# Patient Record
Sex: Female | Born: 1995 | Race: Black or African American | Hispanic: No | Marital: Single | Smoking: Current every day smoker
Health system: Southern US, Community
[De-identification: ages and names within clinical notes are randomized; demographics above are authoritative.]

## PROBLEM LIST (undated history)

## (undated) ENCOUNTER — Emergency Department (HOSPITAL_COMMUNITY): Discharge status: Absent without leave | Payer: Self-pay

## (undated) DIAGNOSIS — R51 Headache: Secondary | ICD-10-CM

## (undated) DIAGNOSIS — R519 Headache, unspecified: Secondary | ICD-10-CM

---

## 2017-03-20 ENCOUNTER — Emergency Department (HOSPITAL_COMMUNITY): Payer: Self-pay

## 2017-03-20 ENCOUNTER — Emergency Department (HOSPITAL_COMMUNITY)
Admission: EM | Admit: 2017-03-20 | Discharge: 2017-03-20 | Disposition: A | Discharge status: Home / Self Care | Payer: Self-pay | Attending: Emergency Medicine | Admitting: Emergency Medicine

## 2017-03-20 ENCOUNTER — Encounter (HOSPITAL_COMMUNITY): Payer: Self-pay | Admitting: Emergency Medicine

## 2017-03-20 DIAGNOSIS — K529 Noninfective gastroenteritis and colitis, unspecified: Secondary | ICD-10-CM | POA: Insufficient documentation

## 2017-03-20 LAB — COMPREHENSIVE METABOLIC PANEL
ALT: 14 U/L (ref 14–54)
AST: 17 U/L (ref 15–41)
Albumin: 3.5 g/dL (ref 3.5–5.0)
Alkaline Phosphatase: 42 U/L (ref 38–126)
Anion gap: 8 (ref 5–15)
BUN: 10 mg/dL (ref 6–20)
CO2: 29 mmol/L (ref 22–32)
Calcium: 9 mg/dL (ref 8.9–10.3)
Chloride: 101 mmol/L (ref 101–111)
Creatinine, Ser: 0.79 mg/dL (ref 0.44–1.00)
GFR calc Af Amer: >60 mL/min (ref >60)
GFR calc non Af Amer: >60 mL/min (ref >60)
Glucose, Bld: 88 mg/dL (ref 65–99)
Potassium: 3.8 mmol/L (ref 3.5–5.1)
Sodium: 138 mmol/L (ref 135–145)
Total Bilirubin: 0.6 mg/dL (ref 0.3–1.2)
Total Protein: 7.1 g/dL (ref 6.5–8.1)

## 2017-03-20 LAB — URINALYSIS, ROUTINE W REFLEX MICROSCOPIC
Bilirubin Urine: NEGATIVE
Glucose, UA: NEGATIVE mg/dL
Hgb urine dipstick: NEGATIVE
Ketones, ur: 20 mg/dL — AB
Nitrite: NEGATIVE
Protein, ur: NEGATIVE mg/dL
Specific Gravity, Urine: 1.019 (ref 1.005–1.030)
pH: 7 (ref 5.0–8.0)

## 2017-03-20 LAB — LIPASE, BLOOD: Lipase: 18 U/L (ref 11–51)

## 2017-03-20 LAB — CBC
HCT: 33.2 % — ABNORMAL LOW (ref 36.0–46.0)
Hemoglobin: 11.6 g/dL — ABNORMAL LOW (ref 12.0–15.0)
MCH: 31.8 pg (ref 26.0–34.0)
MCHC: 34.9 g/dL (ref 30.0–36.0)
MCV: 91 fL (ref 78.0–100.0)
Platelets: 316 10*3/uL (ref 150–400)
RBC: 3.65 MIL/uL — ABNORMAL LOW (ref 3.87–5.11)
RDW: 12.3 % (ref 11.5–15.5)
WBC: 5.5 10*3/uL (ref 4.0–10.5)

## 2017-03-20 LAB — POC URINE PREG, ED: Preg Test, Ur: NEGATIVE

## 2017-03-20 MED ORDER — HYDROCODONE-ACETAMINOPHEN 5-325 MG PO TABS: 1.0000 | ORAL_TABLET | Freq: Four times a day (QID) | ORAL | 0 refills | Status: DC | PRN

## 2017-03-20 MED ORDER — IOPAMIDOL (ISOVUE-300) INJECTION 61%
INTRAVENOUS | Status: AC
  Filled 2017-03-20: qty 100

## 2017-03-20 MED ORDER — IOPAMIDOL (ISOVUE-300) INJECTION 61%
100.0000 mL | Freq: Once | INTRAVENOUS | Status: AC | PRN
  Administered 2017-03-20: 100 mL via INTRAVENOUS

## 2017-03-20 MED ORDER — SODIUM CHLORIDE 0.9 % IV BOLUS (SEPSIS)
500.0000 mL | Freq: Once | INTRAVENOUS | Status: AC
  Administered 2017-03-20: 500 mL via INTRAVENOUS

## 2017-03-20 MED ORDER — SODIUM CHLORIDE 0.9 % IV SOLN
INTRAVENOUS | Status: DC
  Administered 2017-03-20: 18:00:00 via INTRAVENOUS

## 2017-03-20 MED ORDER — ACETAMINOPHEN 325 MG PO TABS: 650.0000 mg | ORAL_TABLET | Freq: Once | ORAL | Status: DC

## 2017-03-20 NOTE — ED Provider Notes (Signed)
[facility]-EMERGENCY DEPT Provider Note   CSN: [id number] Arrival date & time: 03/20/17  1246     History   Chief Complaint Chief Complaint  Patient presents with  . Abdominal Pain    HPI Erin Christensen is a 21 y.o. female.  Patient with complaint of right-sided abdominal pain that began 3 days ago. Initially started in the right lower quadrant then moved to the right upper quadrant. Patient feels that she's had problems with constipation no nausea no vomiting. No diarrhea. Patient has felt she's had dysuria. No fevers. No similar symptoms in the past. Pain is been constant on the right side.      History reviewed. No pertinent past medical history.  There are no active problems to display for this patient.   History reviewed. No pertinent surgical history.  OB History    No data available       Home Medications    Prior to Admission medications   Medication Sig Start Date End Date Taking? Authorizing Provider  HYDROcodone-acetaminophen (NORCO/VICODIN) 5-325 MG tablet Take 1-2 tablets by mouth every 6 (six) hours as needed for moderate pain. 03/20/17   Vanetta Mulders, MD    Family History No family history on file.  Social History Social History  Substance Use Topics  . Smoking status: Never Smoker  . Smokeless tobacco: Not on file  . Alcohol use No     Allergies   Patient has no known allergies.   Review of Systems Review of Systems  Constitutional: Negative for fever.  HENT: Negative for congestion.   Eyes: Negative for visual disturbance.  Respiratory: Negative for shortness of breath.   Cardiovascular: Negative for chest pain.  Gastrointestinal: Positive for abdominal pain and constipation. Negative for diarrhea, nausea and vomiting.  Genitourinary: Positive for dysuria.  Musculoskeletal: Negative for back pain.  Skin: Negative for rash.  Neurological: Negative for headaches.  Hematological: Does not bruise/bleed easily.  Psychiatric/Behavioral:  Negative for confusion.     Physical Exam Updated Vital Signs BP 130/79 (BP Location: Left Arm)   Pulse 86   Temp 98.3 F (36.8 C) (Oral)   Resp 18   Ht 1.753 m ( )   Wt 89.8 kg (198 lb)   LMP 02/18/2017 Comment: Upreg neg 03/20/17  SpO2 100%   BMI 29.24 kg/m   Physical Exam  Constitutional: She is oriented to person, place, and time. She appears well-developed and well-nourished. No distress.  HENT:  Head: Normocephalic and atraumatic.  Mouth/Throat: Oropharynx is clear and moist.  Eyes: Pupils are equal, round, and reactive to light. Conjunctivae and EOM are normal.  Neck: Normal range of motion. Neck supple.  Cardiovascular: Normal rate, regular rhythm and intact distal pulses.   Pulmonary/Chest: Effort normal and breath sounds normal. No respiratory distress.  Abdominal: Soft. Bowel sounds are normal. There is no tenderness.  Musculoskeletal: Normal range of motion.  Neurological: She is alert and oriented to person, place, and time. No cranial nerve deficit or sensory deficit. She exhibits normal muscle tone. Coordination normal.  Skin: Skin is warm. No rash noted.  Nursing note and vitals reviewed.    ED Treatments / Results  Labs (all labs ordered are listed, but only abnormal results are displayed) Labs Reviewed  CBC - Abnormal; Notable for the following:       Result Value   RBC 3.65 (*)    Hemoglobin 11.6 (*)    HCT 33.2 (*)    All other components within normal limits  URINALYSIS, ROUTINE  W REFLEX MICROSCOPIC - Abnormal; Notable for the following:    Ketones, ur 20 (*)    Leukocytes, UA TRACE (*)    Bacteria, UA RARE (*)    Squamous Epithelial / LPF 0-5 (*)    All other components within normal limits  LIPASE, BLOOD  COMPREHENSIVE METABOLIC PANEL  POC URINE PREG, ED    EKG  EKG Interpretation None       Radiology Ct Abdomen Pelvis W Contrast  Result Date: 03/20/2017 CLINICAL DATA:  Right-sided abdominal pain.  Possible fever. EXAM: CT  ABDOMEN AND PELVIS WITH CONTRAST TECHNIQUE: Multidetector CT imaging of the abdomen and pelvis was performed using the standard protocol following bolus administration of intravenous contrast. CONTRAST:  ISOVUE-300 IOPAMIDOL (ISOVUE-300) INJECTION 61% COMPARISON:  None. FINDINGS: Lower chest: No acute abnormality. Hepatobiliary: No focal liver abnormality is seen. No gallstones, gallbladder wall thickening, or biliary dilatation. Pancreas: Unremarkable. No pancreatic ductal dilatation or surrounding inflammatory changes. Spleen: Normal in size without focal abnormality. Adrenals/Urinary Tract: Adrenal glands are unremarkable. Kidneys are normal, without renal calculi, focal lesion, or hydronephrosis. Bladder is unremarkable. Stomach/Bowel: The stomach is normal in appearance. Evaluation of the small bowel and colon is limited due to lack of oral contrast. However, there is suggestion of colonic wall thickening and adjacent fat stranding involving the ascending colon, transverse colon, and descending colon. No pneumatosis. It would be difficult to confidently exclude small bowel involvement. There is increased attenuation of fat of the mesentary. No bowel obstruction. The appendix is best visualized on coronal images with no evidence of appendicitis. Vascular/Lymphatic: No significant vascular findings are present. No enlarged abdominal or pelvic lymph nodes. Reproductive: The uterus is deviated to the right but otherwise unremarkable. No adnexal masses are seen. Other: There is a fat containing periumbilical hernia. Musculoskeletal: No acute or significant osseous findings. IMPRESSION: 1. Evaluation of the large and small bowel is limited due to lack of oral contrast. However, there is suggested colonic wall thickening and adjacent fat stranding consistent with colitis. Involvement of the small bowel cannot be confidently excluded. Infectious and inflammatory etiologies are possible. Inflammatory bowel disease  should be considered. Vascular causes are considered less likely. 2. No other acute abnormalities. Electronically Signed   By: Gerome Sam III M.D   On: 03/20/2017 17:55    Procedures Procedures (including critical care time)  Medications Ordered in ED Medications  0.9 %  sodium chloride infusion ( Intravenous New Bag/Given 03/20/17 1747)  sodium chloride 0.9 % bolus 500 mL (500 mLs Intravenous New Bag/Given 03/20/17 1747)  iopamidol (ISOVUE-300) 61 % injection 100 mL (100 mLs Intravenous Contrast Given 03/20/17 1734)     Initial Impression / Assessment and Plan / ED Course  I have reviewed the triage vital signs and the nursing notes.  Pertinent labs & imaging results that were available during my care of the patient were reviewed by me and considered in my medical decision making (see chart for details).     CT scan suggestive of colitis. No evidence of appendicitis no evidence of any gallbladder problems. Patient's labs without a sniffing abnormalities. No leukocytosis. Urinalysis negative for urinary tract infection.Also pregnancy test negative.  Patient nontoxic no acute distress. Will treat symptomatically and have her follow-up with gastroenterology. Symptoms could be consistent with inflammatory bowel disease.  Final Clinical Impressions(s) / ED Diagnoses   Final diagnoses:  Colitis    New Prescriptions New Prescriptions   HYDROCODONE-ACETAMINOPHEN (NORCO/VICODIN) 5-325 MG TABLET    Take 1-2 tablets by  mouth every 6 (six) hours as needed for moderate pain.     Vanetta MuldersZackowski, Kiara Keep, MD 03/20/17 [address] [id number]

## 2017-03-20 NOTE — Discharge Instructions (Signed)
Would recommend a bland diet. Take pain medicine as needed and as directed. Follow-up with gastroenterology if not improving over the next several days. Return for any new or worse symptoms.

## 2017-03-20 NOTE — ED Triage Notes (Signed)
Pt from home with c/o right sided abdominal pain that began about 3 days ago. Pt states pain is a sharp pressure that she describes as 9/10. Pt reports intermittent constipation and irregular period last month. Pt denies emesis.

## 2017-04-04 ENCOUNTER — Ambulatory Visit (INDEPENDENT_AMBULATORY_CARE_PROVIDER_SITE_OTHER): Payer: Self-pay | Admitting: Physician Assistant

## 2017-04-04 ENCOUNTER — Encounter: Payer: Self-pay | Admitting: Physician Assistant

## 2017-04-04 VITALS — BP 102/68 | HR 76 | Ht 67.5 in | Wt 193.0 lb

## 2017-04-04 DIAGNOSIS — R9389 Abnormal findings on diagnostic imaging of other specified body structures: Secondary | ICD-10-CM

## 2017-04-04 DIAGNOSIS — R938 Abnormal findings on diagnostic imaging of other specified body structures: Secondary | ICD-10-CM

## 2017-04-04 DIAGNOSIS — K5900 Constipation, unspecified: Secondary | ICD-10-CM

## 2017-04-04 NOTE — Progress Notes (Signed)
Reviewed and agree with initial management plan.  Dalila Arca T. Aleira Deiter, MD FACG 

## 2017-04-04 NOTE — Progress Notes (Signed)
Chief Complaint: Abdominal Pain  HPI:  Erin Christensen is a 21 year old female with no significant past medical history who presents to clinic today for follow-up after being seen in the ER and diagnosed with colitis.   Per review of chart, patient was seen in the ER 03/20/17 and at that time described a right-sided abdominal pain that had begun 3 days prior. This initially started in the right lower quadrant and moved to the right upper quadrant. She described problems with constipation but no nausea, vomiting or diarrhea. Labs at that time showed a CBC with a minimally decreased hemoglobin 11.6, urinalysis showed trace leukocytes and rare bacteria, lipase, CMP and urine pregnancy test were negative/normal. Patient had a CT of the abdomen and pelvis with contrast which showed that evaluation of large and small bowel was limited due to lack of oral contrast, however there was suggested colonic wall thickening and adjacent fat stranding consistent with colitis. Involvement of the small bowel could not be confidently excluded. Infectious and inflammatory etiologies were possible. Inflammatory bowel disease should be considered. Vascular causes were considered less likely. No other acute abnormalities. Patient was given hydrocodone and told to follow with our clinic.    Today, the patient presents to clinic and tells me that all of her symptoms are gone. She remained on a bland diet up until today and had used her Hydrocodone occasionally for abdominal pain but has not used this in many days. Patient describes trouble with constipation prior to her ER visit, but since then she has had a daily solid stool in the morning "except for this morning". Patient does describe that she "went hard on [place] food" for 3 days leading up to her diagnosis of colitis. The patient feels overall improved. She does tell me that when she sneezes she will have a little bit of right upper quadrant "pinching", but this is "nothing  compared to what it was". Patient denies family history of ulcerative colitis or Crohn's.   Patient denies fever, chills, blood in her stool, melena, weight loss, fatigue, anorexia, nausea, vomiting, heartburn or reflux.  Past medical and surgical history: None  Current Outpatient Prescriptions  Medication Sig Dispense Refill  . HYDROcodone-acetaminophen (NORCO/VICODIN) 5-325 MG tablet Take 1-2 tablets by mouth every 6 (six) hours as needed for moderate pain. 10 tablet 0   No current facility-administered medications for this visit.     Allergies as of 04/04/2017  . (No Known Allergies)    No family history on file.  Social History   Social History  . Marital status: Single    Spouse name: N/A  . Number of children: N/A  . Years of education: N/A   Occupational History  . Not on file.   Social History Main Topics  . Smoking status: Never Smoker  . Smokeless tobacco: Not on file  . Alcohol use No  . Drug use: No  . Sexual activity: Not on file   Other Topics Concern  . Not on file   Social History Narrative  . No narrative on file    Review of Systems:    Constitutional: No weight loss, fever or chills Skin: No rash Cardiovascular: No chest pain Respiratory: No SOB  Gastrointestinal: See HPI and otherwise negative Genitourinary: No dysuria  Neurological: No headache Musculoskeletal: No new muscle or joint pain Hematologic: No bruising Psychiatric: No history of depression or anxiety   Physical Exam:  Vital signs: BP 102/68   Pulse 76   Ht 5' 7.5" (  1.715 m)   Wt 193 lb (87.5 kg)   LMP 03/04/2017 (Approximate)   BMI 29.78 kg/m    Constitutional:   Very Pleasant and Polite AA female appears to be in NAD, Well developed, Well nourished, alert and cooperative Head:  Normocephalic and atraumatic. Eyes:   PEERL, EOMI. No icterus. Conjunctiva pink. Ears:  Normal auditory acuity. Neck:  Supple Throat: Oral cavity and pharynx without inflammation, swelling  or lesion.  Respiratory: Respirations even and unlabored. Lungs clear to auscultation bilaterally.   No wheezes, crackles, or rhonchi.  Cardiovascular: Normal S1, S2. No MRG. Regular rate and rhythm. No peripheral edema, cyanosis or pallor.  Gastrointestinal:  Soft, nondistended, nontender. No rebound or guarding. Normal bowel sounds. No appreciable masses or hepatomegaly. Rectal:  Not performed.  Msk:  Symmetrical without gross deformities. Without edema, no deformity or joint abnormality.  Neurologic:  Alert and  oriented x4;  grossly normal neurologically.  Skin:   Dry and intact without significant lesions or rashes. Psychiatric:  Demonstrates good judgement and reason without abnormal affect or behaviors.  MOST RECENT LABS AND IMAGING: CBC    Component Value Date/Time   WBC 5.5 03/20/2017 1344   RBC 3.65 (L) 03/20/2017 1344   HGB 11.6 (L) 03/20/2017 1344   HCT 33.2 (L) 03/20/2017 1344   PLT 316 03/20/2017 1344   MCV 91.0 03/20/2017 1344   MCH 31.8 03/20/2017 1344   MCHC 34.9 03/20/2017 1344   RDW 12.3 03/20/2017 1344    CMP     Component Value Date/Time   NA 138 03/20/2017 1344   K 3.8 03/20/2017 1344   CL 101 03/20/2017 1344   CO2 29 03/20/2017 1344   GLUCOSE 88 03/20/2017 1344   BUN 10 03/20/2017 1344   CREATININE 0.79 03/20/2017 1344   CALCIUM 9.0 03/20/2017 1344   PROT 7.1 03/20/2017 1344   ALBUMIN 3.5 03/20/2017 1344   AST 17 03/20/2017 1344   ALT 14 03/20/2017 1344   ALKPHOS 42 03/20/2017 1344   BILITOT 0.6 03/20/2017 1344   GFRNONAA >60 03/20/2017 1344   GFRAA >60 03/20/2017 1344   Ct Abdomen Pelvis W Contrast  Result Date: 03/20/2017 CLINICAL DATA:  Right-sided abdominal pain.  Possible fever. EXAM: CT ABDOMEN AND PELVIS WITH CONTRAST TECHNIQUE: Multidetector CT imaging of the abdomen and pelvis was performed using the standard protocol following bolus administration of intravenous contrast. CONTRAST:  ISOVUE-300 IOPAMIDOL (ISOVUE-300) INJECTION 61%  COMPARISON:  None. FINDINGS: Lower chest: No acute abnormality. Hepatobiliary: No focal liver abnormality is seen. No gallstones, gallbladder wall thickening, or biliary dilatation. Pancreas: Unremarkable. No pancreatic ductal dilatation or surrounding inflammatory changes. Spleen: Normal in size without focal abnormality. Adrenals/Urinary Tract: Adrenal glands are unremarkable. Kidneys are normal, without renal calculi, focal lesion, or hydronephrosis. Bladder is unremarkable. Stomach/Bowel: The stomach is normal in appearance. Evaluation of the small bowel and colon is limited due to lack of oral contrast. However, there is suggestion of colonic wall thickening and adjacent fat stranding involving the ascending colon, transverse colon, and descending colon. No pneumatosis. It would be difficult to confidently exclude small bowel involvement. There is increased attenuation of fat of the mesentary. No bowel obstruction. The appendix is best visualized on coronal images with no evidence of appendicitis. Vascular/Lymphatic: No significant vascular findings are present. No enlarged abdominal or pelvic lymph nodes. Reproductive: The uterus is deviated to the right but otherwise unremarkable. No adnexal masses are seen. Other: There is a fat containing periumbilical hernia. Musculoskeletal: No acute or significant  osseous findings. IMPRESSION: 1. Evaluation of the large and small bowel is limited due to lack of oral contrast. However, there is suggested colonic wall thickening and adjacent fat stranding consistent with colitis. Involvement of the small bowel cannot be confidently excluded. Infectious and inflammatory etiologies are possible. Inflammatory bowel disease should be considered. Vascular causes are considered less likely. 2. No other acute abnormalities. Electronically Signed   By: Gerome Sam III M.D   On: 03/20/2017 17:55   Assessment: 1. Abnormal CT: Showing colitis as above, symptoms have all  resolved now; likely this represented an infectious colitis 2. Constipation: Chronic for the patient prior to above, recently she has been having a daily solid stool  Plan: 1. Recommend the patient increase fiber in diet to at least 25-35 g per day with the use of a fiber supplement 2. Recommend the patient increase water intake to 6-8 8 ounce glasses of water per day 3. Recommend the patient increase exercise on a daily basis 4. Patient to return to clinic as needed in the future. If she does have a repeat of symptoms above would recommend a colonoscopy. She was assigned to Dr. Russella Dar today as he is supervising.  Erin Meeker, PA-C [facility] 04/04/2017, 9:30 AM  Cc: No ref. provider found

## 2017-04-04 NOTE — Patient Instructions (Signed)
We have given you a high fiber diet handout. Please strive to have 25-30 grams of fiber daily. A high fiber diet with plenty of fluids (up to 8 glasses of water daily) is suggested to relieve these symptoms. Also try at least 30 minutes of heart racing exercised at least three times a week.

## 2018-01-18 ENCOUNTER — Emergency Department (HOSPITAL_COMMUNITY): Payer: Self-pay

## 2018-01-18 ENCOUNTER — Encounter (HOSPITAL_COMMUNITY): Payer: Self-pay | Admitting: *Deleted

## 2018-01-18 ENCOUNTER — Other Ambulatory Visit: Payer: Self-pay

## 2018-01-18 ENCOUNTER — Emergency Department (HOSPITAL_COMMUNITY)
Admission: EM | Admit: 2018-01-18 | Discharge: 2018-01-19 | Disposition: A | Discharge status: Home / Self Care | Payer: Self-pay | Attending: Emergency Medicine | Admitting: Emergency Medicine

## 2018-01-18 DIAGNOSIS — A5901 Trichomonal vulvovaginitis: Secondary | ICD-10-CM

## 2018-01-18 DIAGNOSIS — F17200 Nicotine dependence, unspecified, uncomplicated: Secondary | ICD-10-CM | POA: Insufficient documentation

## 2018-01-18 DIAGNOSIS — F23 Brief psychotic disorder: Secondary | ICD-10-CM | POA: Insufficient documentation

## 2018-01-18 DIAGNOSIS — F29 Unspecified psychosis not due to a substance or known physiological condition: Secondary | ICD-10-CM

## 2018-01-18 DIAGNOSIS — R45851 Suicidal ideations: Secondary | ICD-10-CM | POA: Insufficient documentation

## 2018-01-18 HISTORY — DX: Headache: R51

## 2018-01-18 HISTORY — DX: Headache, unspecified: R51.9

## 2018-01-18 LAB — CBC
HCT: 40.9 % (ref 36.0–46.0)
Hemoglobin: 14.1 g/dL (ref 12.0–15.0)
MCH: 32.4 pg (ref 26.0–34.0)
MCHC: 34.5 g/dL (ref 30.0–36.0)
MCV: 94 fL (ref 78.0–100.0)
Platelets: 325 10*3/uL (ref 150–400)
RBC: 4.35 MIL/uL (ref 3.87–5.11)
RDW: 12.2 % (ref 11.5–15.5)
WBC: 6.3 10*3/uL (ref 4.0–10.5)

## 2018-01-18 LAB — COMPREHENSIVE METABOLIC PANEL
ALT: 14 U/L (ref 0–44)
AST: 23 U/L (ref 15–41)
Albumin: 4.9 g/dL (ref 3.5–5.0)
Alkaline Phosphatase: 43 U/L (ref 38–126)
Anion gap: 11 (ref 5–15)
BUN: 26 mg/dL — ABNORMAL HIGH (ref 6–20)
CO2: 24 mmol/L (ref 22–32)
Calcium: 9.9 mg/dL (ref 8.9–10.3)
Chloride: 108 mmol/L (ref 98–111)
Creatinine, Ser: 1.32 mg/dL — ABNORMAL HIGH (ref 0.44–1.00)
GFR calc Af Amer: >60 mL/min (ref >60)
GFR calc non Af Amer: 56 mL/min — ABNORMAL LOW (ref >60)
Glucose, Bld: 120 mg/dL — ABNORMAL HIGH (ref 70–99)
Potassium: 3.7 mmol/L (ref 3.5–5.1)
Sodium: 143 mmol/L (ref 135–145)
Total Bilirubin: 0.8 mg/dL (ref 0.3–1.2)
Total Protein: 8.6 g/dL — ABNORMAL HIGH (ref 6.5–8.1)

## 2018-01-18 LAB — URINALYSIS, ROUTINE W REFLEX MICROSCOPIC
Glucose, UA: NEGATIVE mg/dL
Hgb urine dipstick: NEGATIVE
Ketones, ur: 20 mg/dL — AB
Leukocytes, UA: NEGATIVE
Nitrite: NEGATIVE
Protein, ur: 100 mg/dL — AB
Specific Gravity, Urine: 1.028 (ref 1.005–1.030)
pH: 5 (ref 5.0–8.0)

## 2018-01-18 LAB — WET PREP, GENITAL
Sperm: NONE SEEN
Yeast Wet Prep HPF POC: NONE SEEN

## 2018-01-18 LAB — ACETAMINOPHEN LEVEL: Acetaminophen (Tylenol), Serum: <10 ug/mL — ABNORMAL LOW (ref 10–30)

## 2018-01-18 LAB — SALICYLATE LEVEL: Salicylate Lvl: <7 mg/dL (ref 2.8–30.0)

## 2018-01-18 LAB — RAPID URINE DRUG SCREEN, HOSP PERFORMED
Amphetamines: NOT DETECTED
Benzodiazepines: NOT DETECTED
Cocaine: NOT DETECTED
Opiates: NOT DETECTED
Tetrahydrocannabinol: POSITIVE — AB

## 2018-01-18 LAB — ETHANOL: Alcohol, Ethyl (B): <10 mg/dL (ref <10)

## 2018-01-18 LAB — I-STAT BETA HCG BLOOD, ED (MC, WL, AP ONLY): I-stat hCG, quantitative: <5 m[IU]/mL (ref <5)

## 2018-01-18 LAB — VITAMIN B12: Vitamin B-12: 326 pg/mL (ref 180–914)

## 2018-01-18 MED ORDER — CEFTRIAXONE SODIUM 250 MG IJ SOLR
250.0000 mg | Freq: Once | INTRAMUSCULAR | Status: AC
  Administered 2018-01-18: 250 mg via INTRAMUSCULAR
  Filled 2018-01-18: qty 250

## 2018-01-18 MED ORDER — AZITHROMYCIN 250 MG PO TABS
1000.0000 mg | ORAL_TABLET | Freq: Once | ORAL | Status: AC
  Administered 2018-01-18: 1000 mg via ORAL
  Filled 2018-01-18: qty 4

## 2018-01-18 MED ORDER — ACETAMINOPHEN 325 MG PO TABS: 650.0000 mg | ORAL_TABLET | ORAL | Status: DC | PRN

## 2018-01-18 MED ORDER — SODIUM CHLORIDE 0.9 % IV BOLUS
1000.0000 mL | Freq: Once | INTRAVENOUS | Status: AC
  Administered 2018-01-18: 1000 mL via INTRAVENOUS

## 2018-01-18 MED ORDER — METRONIDAZOLE 500 MG PO TABS
2000.0000 mg | ORAL_TABLET | Freq: Once | ORAL | Status: AC
  Administered 2018-01-18: 2000 mg via ORAL
  Filled 2018-01-18: qty 4

## 2018-01-18 MED ORDER — STERILE WATER FOR INJECTION IJ SOLN
INTRAMUSCULAR | Status: AC
  Administered 2018-01-18: 10 mL
  Filled 2018-01-18: qty 10

## 2018-01-18 NOTE — BH Assessment (Addendum)
Assessment Note  Erin Christensen is an 22 y.o. female.  -Clinician reviewed note by Jodi Geralds, PA.  Female who is otherwise healthy, presents via EMS under IVC from [employer] for evaluation of hallucinations.  EMS reports patient was found going into other people's apartments and telling them she is God, [employer] took her back to her apartment and EMS was called.  Patient was found with a bottle of Zofran that had a different type of medication it which the patient reports as B12 vitamins that she takes once daily she denies being on any other medications or any other ingestions.  When asked why she is here the patient reports they say hallucinations, but she reports "I am not hallucinating I am God and I am here to deliver the message to my people".  Patient denies any desire to harm herself or others and reports "I love all my people".  Patient is blunted and tearful during assessment.  She asks for almost all questions to be repeated.  Patient lives with two other roommates in a apartment.  Her parents live in [place].  Patient admits to entering other people's apartments.  She says "I am God and I want my people to know that they are free."  She says "It is so" after many of her statements.  Patient denies SI or HI.  She denies auditory hallucinations but appears to be responding to internal stimuli.  She denies visual hallucinations.  When asked when she started feeling like she was God she says "at the beginning of the month."  She denies any specific stressor.  She is unemployed and has no legal issues.    Patient denies any current or past inpatient or outpatient care.  She denies any use of marijuana but it is in her UDS.  She said she must have gotten it "second hand" because she does not smoke it.  -Clinician discussed patient care with Donell Sievert, PA.  He recommends inpatient psychiatric care for stabilization.  Pt is on IVC so first opinion to be completed by psychiatry team in AM on  07/08.   Diagnosis: F23 Brief psychotic d/o  Past Medical History:  Past Medical History:  Diagnosis Date  . Generalized headaches     History reviewed. No pertinent surgical history.  Family History:  Family History  Problem Relation Age of Onset  . Colon cancer Neg Hx   . Stomach cancer Neg Hx   . Esophageal cancer Neg Hx     Social History:  reports that she has been smoking.  She has smoked for the past 0.50 years. She has never used smokeless tobacco. She reports that she drinks alcohol. She reports that she does not use drugs.  Additional Social History:  Alcohol / Drug Use Pain Medications: None Prescriptions: None Over the Counter: None History of alcohol / drug use?: Yes Substance #1 Name of Substance 1: Marijuana 1 - Age of First Use: Pt says she does not smoke marijuana 1 - Amount (size/oz): Pt reports no marijuana use 1 - Frequency: No marijuana use 1 - Duration: None 1 - Last Use / Amount: Pt tested positive for THC in UDS.  CIWA: CIWA-Ar BP: (!) 132/97 Pulse Rate: 79 COWS:    Allergies: No Known Allergies  Home Medications:  (Not in a hospital admission)  OB/GYN Status:  No LMP recorded.  General Assessment Data Location of Assessment: [facility] TTS Assessment: In system Is this a Tele or Face-to-Face Assessment?: Face-to-Face Is this an  Initial Assessment or a Re-assessment for this encounter?: Initial Assessment Marital status: Single Is patient pregnant?: No Pregnancy Status: No Living Arrangements: Non-relatives/Friends(Has two roommates.) Can pt return to current living arrangement?: Yes Admission Status: Involuntary Is patient capable of signing voluntary admission?: No Referral Source: Other(Police brought her in.) Insurance type: Self pay     Crisis Care Plan Living Arrangements: Non-relatives/Friends(Has two roommates.) Name of Psychiatrist: None Name of Therapist: None  Education Status Is patient currently in school?: No Is  the patient employed, unemployed or receiving disability?: Unemployed  Risk to self with the past 6 months Suicidal Ideation: No Has patient been a risk to self within the past 6 months prior to admission? : No Suicidal Intent: No Has patient had any suicidal intent within the past 6 months prior to admission? : No Is patient at risk for suicide?: No Suicidal Plan?: No Has patient had any suicidal plan within the past 6 months prior to admission? : No Access to Means: No What has been your use of drugs/alcohol within the last 12 months?: THC in UDS Previous Attempts/Gestures: No How many times?: 0 Other Self Harm Risks: None Triggers for Past Attempts: None known Intentional Self Injurious Behavior: None Family Suicide History: No Recent stressful life event(s): Other (Comment)(Pt does not identify any stressors.) Persecutory voices/beliefs?: Yes Depression: Yes Depression Symptoms: Tearfulness, Loss of interest in usual pleasures, Insomnia, Despondent Substance abuse history and/or treatment for substance abuse?: No Suicide prevention information given to non-admitted patients: Not applicable  Risk to Others within the past 6 months Homicidal Ideation: No Does patient have any lifetime risk of violence toward others beyond the six months prior to admission? : No Thoughts of Harm to Others: No Current Homicidal Intent: No Current Homicidal Plan: No Access to Homicidal Means: No Identified Victim: No one History of harm to others?: No Assessment of Violence: None Noted Violent Behavior Description: Pt denies Does patient have access to weapons?: No Criminal Charges Pending?: No Does patient have a court date: No Is patient on probation?: No  Psychosis Hallucinations: Auditory(Pt denies but appears to have internal stimuli) Delusions: Persecutory, Grandiose(God speaking through her.)  Mental Status Report Appearance/Hygiene: Disheveled, In scrubs Eye Contact: Fair Motor  Activity: Freedom of movement, Unremarkable Speech: Incoherent, Soft Level of Consciousness: Alert, Crying Mood: Depressed, Anxious, Despair, Helpless, Sad Affect: Apprehensive, Sad, Depressed Anxiety Level: Moderate Thought Processes: Irrelevant, Tangential Judgement: Impaired Orientation: Person, Place, Situation Obsessive Compulsive Thoughts/Behaviors: Minimal  Cognitive Functioning Concentration: Decreased Memory: Unable to Assess Is patient IDD: No Is patient DD?: No Insight: Poor Impulse Control: Poor Appetite: Fair Have you had any weight changes? : No Change Sleep: No Change Total Hours of Sleep: (7 hours) Vegetative Symptoms: Decreased grooming  ADLScreening [facility] Assessment [facility]) Patient's cognitive ability adequate to safely complete daily activities?: Yes Patient able to express need for assistance with ADLs?: Yes Independently performs ADLs?: Yes (appropriate for developmental age)  Prior Inpatient Therapy Prior Inpatient Therapy: No  Prior Outpatient Therapy Prior Outpatient Therapy: No Does patient have an ACCT team?: No Does patient have Intensive In-House Services?  : No Does patient have [facility] services? : No Does patient have P4CC services?: No  ADL Screening (condition at time of admission) Patient's cognitive ability adequate to safely complete daily activities?: Yes Is the patient deaf or have difficulty hearing?: No Does the patient have difficulty seeing, even when wearing glasses/contacts?: No Does the patient have difficulty concentrating, remembering, or making decisions?: No Patient able to express need for  assistance with ADLs?: Yes Does the patient have difficulty dressing or bathing?: No Independently performs ADLs?: Yes (appropriate for developmental age) Does the patient have difficulty walking or climbing stairs?: No Weakness of Legs: None Weakness of Arms/Hands: None       Abuse/Neglect Assessment (Assessment to be complete  while patient is alone) Abuse/Neglect Assessment Can Be Completed: Yes Physical Abuse: Denies Verbal Abuse: Denies Sexual Abuse: Denies(Pt tearful.) Exploitation of patient/patient's resources: Denies Self-Neglect: Denies     Advance Directives (For Healthcare) Does Patient Have a Medical Advance Directive?: No Would patient like information on creating a medical advance directive?: No - Patient declined          Disposition:  Disposition Initial Assessment Completed for this Encounter: Yes Patient referred to: Other (Comment)(Pt to be reviewed with PA)  On Site Evaluation by:   Reviewed with Physician:    Alexandria Lodge 01/18/2018 10:19 PM

## 2018-01-18 NOTE — ED Notes (Signed)
Bed: [id number] Expected date:  Expected time:  Means of arrival:  Comments: 22 yo psychotic behavior w/[employer]

## 2018-01-18 NOTE — ED Notes (Signed)
Lab notified to add on Vit B 12 to labs

## 2018-01-18 NOTE — ED Notes (Signed)
Pt verbally gave permission for writer to explain to mother what is happening and POC. Information provided with pt present. Pt then become upset with Clinical research associatewriter and was short with her due to telling her the door and curtain has to be left open. Pt is to be moved to Rm 4 for treatment

## 2018-01-18 NOTE — ED Notes (Signed)
Pt went to CT with sitter

## 2018-01-18 NOTE — ED Notes (Signed)
Pts mother was planning to come back then pt changed her mind and does not want visitors

## 2018-01-18 NOTE — ED Notes (Signed)
1 personal bag with clothing, sandals and cell phone in locker 26

## 2018-01-18 NOTE — ED Triage Notes (Signed)
EMS states pt was going in other people's apartments and telling them she is God. They took her back to her apartment, EMS was called. She still has periods of psychosis, had a bottle with Zofran on label but some other type of substance in bottle. Pt was in handcuffs upon arrival. [employer] in process of obtaining IVC paper work. 150/100-120-98%- CBG 159

## 2018-01-18 NOTE — ED Notes (Signed)
Bed: WA03 Expected date:  Expected time:  Means of arrival:  Comments:  TCU 26

## 2018-01-18 NOTE — ED Provider Notes (Signed)
[facility]-EMERGENCY DEPT Provider Note   CSN: [id number] Arrival date & time: 01/18/18  1648     History   Chief Complaint Chief Complaint  Patient presents with  . Hallucinations    HPI Erin Christensen is a 22 y.o. female.  Erin Christensen is a 22 y.o. Female who is otherwise healthy, presents via EMS under IVC from [employer] for evaluation of hallucinations.  EMS reports patient was found going into other people's apartments and telling them she is God, [employer] took her back to her apartment and EMS was called.  Patient was found with a bottle of Zofran that had a different type of medication it which the patient reports as B12 vitamins that she takes once daily she denies being on any other medications or any other ingestions.  When asked why she is here the patient reports they say hallucinations, but she reports "I am not hallucinating I am God and I am here to deliver the message to my people".  Patient denies any desire to harm herself or others and reports "I love all my people".  Patient denies history of similar episode she has never been hospitalized for psychiatric issues and has never been on any psychiatric medications.  She denies alcohol or drug use.  Denies any fevers, headaches, vision changes, chest pain, shortness of breath, abdominal pain, nausea, vomiting, urinary symptoms or vaginal discharge.  No recent head injury or trauma.  Patient's mother is at the bedside and reports no prior history of similar episodes, and that she is otherwise healthy.  She reports when she was born she was diagnosed with syphilis and treated appropriately has had no other issues since then.  Mom denies obvious family history of mental illness although does report to family members that had an "breakdown".     Past Medical History:  Diagnosis Date  . Generalized headaches     There are no active problems to display for this patient.   History reviewed. No pertinent  surgical history.   OB History   None      Home Medications    Prior to Admission medications   Not on File    Family History Family History  Problem Relation Age of Onset  . Colon cancer Neg Hx   . Stomach cancer Neg Hx   . Esophageal cancer Neg Hx     Social History Social History   Tobacco Use  . Smoking status: Current Every Day Smoker    Years: 0.50  . Smokeless tobacco: Never Used  Substance Use Topics  . Alcohol use: Yes    Comment: social  . Drug use: No     Allergies   Patient has no known allergies.   Review of Systems Review of Systems  Constitutional: Negative for chills and fever.  HENT: Negative for congestion, rhinorrhea and sore throat.   Eyes: Negative for pain, redness and visual disturbance.  Respiratory: Negative for cough, chest tightness, shortness of breath and wheezing.   Cardiovascular: Negative for chest pain.  Gastrointestinal: Negative for abdominal pain, diarrhea, nausea and vomiting.  Genitourinary: Negative for dysuria, frequency, vaginal bleeding and vaginal discharge.  Musculoskeletal: Negative for arthralgias and myalgias.  Skin: Negative for color change and rash.  Psychiatric/Behavioral: Positive for hallucinations. Negative for agitation, behavioral problems, self-injury and suicidal ideas.     Physical Exam Updated Vital Signs BP (!) 131/92 (BP Location: Left Arm)   Pulse 86   Temp 98.2 F (36.8 C) (Oral)  Resp 16   SpO2 100%   Physical Exam  Constitutional: She is oriented to person, place, and time. She appears well-developed and well-nourished. No distress.  HENT:  Head: Normocephalic and atraumatic.  Mouth/Throat: Oropharynx is clear and moist.  Eyes: Pupils are equal, round, and reactive to light. EOM are normal. Right eye exhibits no discharge. Left eye exhibits no discharge.  Neck: Neck supple.  Cardiovascular: Normal rate, regular rhythm, normal heart sounds and intact distal pulses.    Pulmonary/Chest: Effort normal and breath sounds normal. No respiratory distress.  Respirations equal and unlabored, patient able to speak in full sentences, lungs clear to auscultation bilaterally  Abdominal: Soft. Bowel sounds are normal. She exhibits no distension and no mass. There is no tenderness. There is no guarding.  Abdomen soft, nondistended, nontender to palpation in all quadrants without guarding or peritoneal signs  Genitourinary:  Genitourinary Comments: Chaperone present during pelvic exam No external genital lesions noted Large amount of yellow-white discharge present in the vaginal vault, no evidence of cervicitis, no cervical motion tenderness, bilateral adnexa without tenderness or masses  Musculoskeletal: She exhibits no edema or deformity.  Neurological: She is alert and oriented to person, place, and time. Coordination normal.  Speech is clear, able to follow commands CN III-XII intact Normal strength in upper and lower extremities bilaterally including dorsiflexion and plantar flexion, strong and equal grip strength Sensation normal to light and sharp touch Moves extremities without ataxia, coordination intact Normal finger to nose and rapid alternating movements No pronator drift  Skin: Skin is warm and dry. Capillary refill takes less than 2 seconds. She is not diaphoretic.  Psychiatric: She has a normal mood and affect. Her behavior is normal. Thought content is delusional. She expresses no homicidal and no suicidal ideation. She expresses no suicidal plans and no homicidal plans.  Nursing note and vitals reviewed.    ED Treatments / Results  Labs (all labs ordered are listed, but only abnormal results are displayed) Labs Reviewed  WET PREP, GENITAL - Abnormal; Notable for the following components:      Result Value   Trich, Wet Prep PRESENT (*)    Clue Cells Wet Prep HPF POC PRESENT (*)    WBC, Wet Prep HPF POC MANY (*)    All other components within  normal limits  COMPREHENSIVE METABOLIC PANEL - Abnormal; Notable for the following components:   Glucose, Bld 120 (*)    BUN 26 (*)    Creatinine, Ser 1.32 (*)    Total Protein 8.6 (*)    GFR calc non Af Amer 56 (*)    All other components within normal limits  ACETAMINOPHEN LEVEL - Abnormal; Notable for the following components:   Acetaminophen (Tylenol), Serum <10 (*)    All other components within normal limits  RAPID URINE DRUG SCREEN, HOSP PERFORMED - Abnormal; Notable for the following components:   Tetrahydrocannabinol POSITIVE (*)    Barbiturates   (*)    Value: Result not available. Reagent lot number recalled by manufacturer.   All other components within normal limits  URINALYSIS, ROUTINE W REFLEX MICROSCOPIC - Abnormal; Notable for the following components:   Color, Urine AMBER (*)    APPearance HAZY (*)    Bilirubin Urine SMALL (*)    Ketones, ur 20 (*)    Protein, ur 100 (*)    Bacteria, UA RARE (*)    Trichomonas, UA PRESENT (*)    All other components within normal limits  ETHANOL  SALICYLATE  LEVEL  CBC  VITAMIN B12  HIV ANTIBODY (ROUTINE TESTING)  RPR  I-STAT BETA HCG BLOOD, ED ([facility], [facility], [facility] ONLY)  GC/CHLAMYDIA PROBE AMP ([place]) NOT [facility]    EKG None  Radiology Ct Head Wo Contrast  Result Date: 01/18/2018 CLINICAL DATA:  Altered mental status, unclear cause. EXAM: CT HEAD WITHOUT CONTRAST TECHNIQUE: Contiguous axial images were obtained from the base of the skull through the vertex without intravenous contrast. COMPARISON:  None. FINDINGS: Brain: Ventricles are normal in size and configuration. There is no mass, hemorrhage, edema or other evidence of acute parenchymal abnormality. No extra-axial hemorrhage. Vascular: No hyperdense vessel or unexpected calcification. Skull: Normal. Negative for fracture or focal lesion. Sinuses/Orbits: No acute finding. Other: None. IMPRESSION: Normal head CT. Electronically Signed   By: Bary RichardStan  Maynard M.D.   On:  01/18/2018 18:33    Procedures Procedures (including critical care time)  Medications Ordered in ED Medications  acetaminophen (TYLENOL) tablet 650 mg (has no administration in time range)  sodium chloride 0.9 % bolus 1,000 mL (0 mLs Intravenous Stopped 01/18/18 2124)  metroNIDAZOLE (FLAGYL) tablet 2,000 mg (2,000 mg Oral Given 01/18/18 2015)  sodium chloride 0.9 % bolus 1,000 mL (0 mLs Intravenous Stopped 01/18/18 2124)  cefTRIAXone (ROCEPHIN) injection 250 mg (250 mg Intramuscular Given 01/18/18 2255)  azithromycin (ZITHROMAX) tablet 1,000 mg (1,000 mg Oral Given 01/18/18 2255)  sterile water (preservative free) injection (10 mLs  Given 01/18/18 2255)     Initial Impression / Assessment and Plan / ED Course  I have reviewed the triage vital signs and the nursing notes.  Pertinent labs & imaging results that were available during my care of the patient were reviewed by me and considered in my medical decision making (see chart for details).  Patient presents for evaluation of hallucinations under IVC.  No prior history of psychosis or hallucinations.  Patient claims she is God, and expresses this delusion multiple times during my evaluation.  Presentation concerning for new onset psychosis.  Will get labs, EKG and CT head to look for possible organic cause.  She denies any focal medical symptoms, vitals stable and no focal findings on exam, normal neurologic exam.  CT head unremarkable.  No leukocytosis, normal hemoglobin, no acute electrolyte derangements, creatinine is elevated at 1.32 with slight elevation in BUN favoring dehydration, will give 2 L fluid bolus here in the ED.  Normal liver function.  Urinalysis positive for trichomonas, discussed this with the patient and she would like to have a pelvic exam and further STD testing.  UDS positive for THC, otherwise negative, negative ethanol, acetaminophen and salicylate levels.  Given that patient presented with large bottle of B12, a B12 level was  ordered which was within normal range.  Patient treated for trichomoniasis, gonorrhea and chlamydia, she has STD testing pending, no concern for PID on exam.  Medical work-up has been reassuring and has not revealed any organic cause for patient's symptoms.  Patient has received 2 L fluid bolus.  She continues to express delusions, and behaves with almost childlike behavior.  At this time she is medically cleared for psychiatric evaluation, TTS consult placed and she has been placed under ED psych hold.  Awaiting TTS recommendations for appropriate disposition.  Final Clinical Impressions(s) / ED Diagnoses   Final diagnoses:  Psychosis, unspecified psychosis type [facility](HCC)  Trichomonal vaginitis    ED Discharge Orders    None       Dartha LodgeFord, Shakiah Wester N, [place] JerseyPA-C 01/18/18 2356  Tilden Fossa, MD 01/19/18 [phone]

## 2018-01-18 NOTE — ED Notes (Signed)
Pelvic supplies at bedside. 

## 2018-01-19 ENCOUNTER — Inpatient Hospital Stay (HOSPITAL_COMMUNITY)
Admission: AD | Admit: 2018-01-19 | Discharge: 2018-01-23 | DRG: 885 | Disposition: A | Discharge status: Home / Self Care | Payer: Federal, State, Local not specified - Other | Source: Intra-hospital | Attending: Psychiatry | Admitting: Psychiatry

## 2018-01-19 ENCOUNTER — Encounter (HOSPITAL_COMMUNITY): Payer: Self-pay | Admitting: *Deleted

## 2018-01-19 ENCOUNTER — Other Ambulatory Visit: Payer: Self-pay

## 2018-01-19 DIAGNOSIS — F29 Unspecified psychosis not due to a substance or known physiological condition: Secondary | ICD-10-CM | POA: Diagnosis not present

## 2018-01-19 DIAGNOSIS — A5901 Trichomonal vulvovaginitis: Secondary | ICD-10-CM | POA: Diagnosis present

## 2018-01-19 DIAGNOSIS — F419 Anxiety disorder, unspecified: Secondary | ICD-10-CM | POA: Diagnosis present

## 2018-01-19 DIAGNOSIS — F23 Brief psychotic disorder: Principal | ICD-10-CM | POA: Diagnosis present

## 2018-01-19 DIAGNOSIS — F22 Delusional disorders: Secondary | ICD-10-CM | POA: Diagnosis present

## 2018-01-19 DIAGNOSIS — F1290 Cannabis use, unspecified, uncomplicated: Secondary | ICD-10-CM | POA: Diagnosis present

## 2018-01-19 LAB — GC/CHLAMYDIA PROBE AMP (~~LOC~~) NOT AT ARMC
Chlamydia: NEGATIVE
Neisseria Gonorrhea: NEGATIVE

## 2018-01-19 LAB — RPR: RPR Ser Ql: NONREACTIVE

## 2018-01-19 LAB — HIV ANTIBODY (ROUTINE TESTING W REFLEX): HIV Screen 4th Generation wRfx: NONREACTIVE

## 2018-01-19 MED ORDER — LORAZEPAM 2 MG/ML IJ SOLN
1.0000 mg | Freq: Four times a day (QID) | INTRAMUSCULAR | Status: DC | PRN
  Administered 2018-01-19: 1 mg via INTRAMUSCULAR
  Filled 2018-01-19: qty 1

## 2018-01-19 MED ORDER — OLANZAPINE 5 MG PO TBDP
5.0000 mg | ORAL_TABLET | Freq: Every day | ORAL | Status: DC
  Administered 2018-01-21: 5 mg via ORAL
  Filled 2018-01-19 (×4): qty 1

## 2018-01-19 MED ORDER — LORAZEPAM 2 MG/ML IJ SOLN: 2.0000 mg | INTRAMUSCULAR | Status: AC

## 2018-01-19 MED ORDER — HYDROXYZINE HCL 25 MG PO TABS
25.0000 mg | ORAL_TABLET | Freq: Three times a day (TID) | ORAL | Status: DC | PRN
  Filled 2018-01-19: qty 10

## 2018-01-19 MED ORDER — TRAZODONE HCL 50 MG PO TABS
50.0000 mg | ORAL_TABLET | Freq: Every evening | ORAL | Status: DC | PRN
  Filled 2018-01-19: qty 7

## 2018-01-19 MED ORDER — OLANZAPINE 5 MG PO TBDP
5.0000 mg | ORAL_TABLET | Freq: Every day | ORAL | Status: DC | PRN
  Administered 2018-01-19: 5 mg via ORAL
  Filled 2018-01-19: qty 1

## 2018-01-19 MED ORDER — ACETAMINOPHEN 325 MG PO TABS: 650.0000 mg | ORAL_TABLET | Freq: Four times a day (QID) | ORAL | Status: DC | PRN

## 2018-01-19 MED ORDER — OLANZAPINE 5 MG PO TBDP
5.0000 mg | ORAL_TABLET | Freq: Every day | ORAL | Status: DC
  Administered 2018-01-19: 5 mg via ORAL
  Filled 2018-01-19: qty 1

## 2018-01-19 MED ORDER — ALUM & MAG HYDROXIDE-SIMETH 200-200-20 MG/5ML PO SUSP: 30.0000 mL | ORAL | Status: DC | PRN

## 2018-01-19 MED ORDER — ZIPRASIDONE MESYLATE 20 MG IM SOLR
INTRAMUSCULAR | Status: AC
Start: 2018-01-19 — End: 2018-01-19
  Administered 2018-01-19: 20 mg via INTRAMUSCULAR
  Filled 2018-01-19: qty 20

## 2018-01-19 MED ORDER — ZIPRASIDONE MESYLATE 20 MG IM SOLR
20.0000 mg | INTRAMUSCULAR | Status: AC
  Administered 2018-01-19: 20 mg via INTRAMUSCULAR
  Filled 2018-01-19: qty 20

## 2018-01-19 MED ORDER — MAGNESIUM HYDROXIDE 400 MG/5ML PO SUSP: 30.0000 mL | Freq: Every day | ORAL | Status: DC | PRN

## 2018-01-19 MED ORDER — LORAZEPAM 1 MG PO TABS
1.0000 mg | ORAL_TABLET | Freq: Four times a day (QID) | ORAL | Status: DC | PRN
  Administered 2018-01-19: 1 mg via ORAL
  Filled 2018-01-19: qty 1

## 2018-01-19 MED ORDER — ACETAMINOPHEN 325 MG PO TABS: 650.0000 mg | ORAL_TABLET | ORAL | Status: DC | PRN

## 2018-01-19 NOTE — ED Notes (Signed)
Patient took medication reluctantly asking if she was leaving soon.  Nurse told her [facility] and she was accepting of that.  Patient told nurse to cut her lights out and close the doors and that she would eat her lunch at a later time.

## 2018-01-19 NOTE — Consult Note (Addendum)
[facility] Consult   Reason for Consult:  Psychosis Referring Physician:  EDP Patient Identification: Erin Christensen MRN:  [id number] Principal Diagnosis: Psychosis [facility]) Diagnosis:   Patient Active Problem List   Diagnosis Date Noted  . Psychosis ([place]) [F29]   . Trichomonal vaginitis [A59.01]     Total Time spent with patient: 45 minutes   Subjective:   Erin Christensen is a 22 y.o. female patient admitted with acute psychosis.  HPI:  Pt was seen and chart reviewed with treatment team and Dr Mariea Clonts. Pt was brought to the [facility] under IVC after she was found at home staring into space and mumbling to herself. Pt had also been entering into other peoples apartments and telling them she was God. During assessment today she was sitting in the chair in her room, guarded, and responding to internal stimuli. Per chart review Pt has no mental health history in this area. Pt was able to state why she was in the hospital but is guarded with thought blocking. Pt refused to answer any further questions. Labs and testing were reviewed. Pt was found to have trichomonas on admission and was treated with IM Rocephin. Pt's UDS positive for THC, BAL negative. Pt was started on Zyprexa for mood stabilization while in the [facility] and has been calm and cooperative and spending most of her time in her room. Pt would benefit form an inpatient psychiatric admission for crisis stabilization and medication management.   Past Psychiatric History: As above  Risk to Self: Suicidal Ideation: No Suicidal Intent: No Is patient at risk for suicide?: No Suicidal Plan?: No Access to Means: No What has been your use of drugs/alcohol within the last 12 months?: THC in UDS How many times?: 0 Other Self Harm Risks: None Triggers for Past Attempts: None known Intentional Self Injurious Behavior: None Risk to Others: Homicidal Ideation: No Thoughts of Harm to Others: No Current Homicidal Intent: No Current  Homicidal Plan: No Access to Homicidal Means: No Identified Victim: No one History of harm to others?: No Assessment of Violence: None Noted Violent Behavior Description: Pt denies Does patient have access to weapons?: No Criminal Charges Pending?: No Does patient have a court date: No Prior Inpatient Therapy: Prior Inpatient Therapy: No Prior Outpatient Therapy: Prior Outpatient Therapy: No Does patient have an ACCT team?: No Does patient have Intensive In-House Services?  : No Does patient have [facility] services? : No Does patient have P4CC services?: No  Past Medical History:  Past Medical History:  Diagnosis Date  . Generalized headaches    History reviewed. No pertinent surgical history. Family History:  Family History  Problem Relation Age of Onset  . Colon cancer Neg Hx   . Stomach cancer Neg Hx   . Esophageal cancer Neg Hx    Family Psychiatric  History: Unknown Social History:  Social History   Substance and Sexual Activity  Alcohol Use Yes   Comment: social     Social History   Substance and Sexual Activity  Drug Use No    Social History   Socioeconomic History  . Marital status: Single    Spouse name: Not on file  . Number of children: Not on file  . Years of education: Not on file  . Highest education level: Not on file  Occupational History  . Not on file  Social Needs  . Financial resource strain: Not on file  . Food insecurity:    Worry: Not on file    Inability:  Not on file  . Transportation needs:    Medical: Not on file    Non-medical: Not on file  Tobacco Use  . Smoking status: Current Every Day Smoker    Years: 0.50  . Smokeless tobacco: Never Used  Substance and Sexual Activity  . Alcohol use: Yes    Comment: social  . Drug use: No  . Sexual activity: Yes  Lifestyle  . Physical activity:    Days per week: Not on file    Minutes per session: Not on file  . Stress: Not on file  Relationships  . Social connections:    Talks  on phone: Not on file    Gets together: Not on file    Attends religious service: Not on file    Active member of club or organization: Not on file    Attends meetings of clubs or organizations: Not on file    Relationship status: Not on file  Other Topics Concern  . Not on file  Social History Narrative  . Not on file   Additional Social History: N/A    Allergies:  No Known Allergies  Labs:  Results for orders placed or performed during the hospital encounter of 01/18/18 (from the past 48 hour(s))  Comprehensive metabolic panel     Status: Abnormal   Collection Time: 01/18/18  5:02 PM  Result Value Ref Range   Sodium 143 135 - 145 mmol/L   Potassium 3.7 3.5 - 5.1 mmol/L   Chloride 108 98 - 111 mmol/L    Comment: Please note change in reference range.   CO2 24 22 - 32 mmol/L   Glucose, Bld 120 (H) 70 - 99 mg/dL    Comment: Please note change in reference range.   BUN 26 (H) 6 - 20 mg/dL    Comment: Please note change in reference range.   Creatinine, Ser 1.32 (H) 0.44 - 1.00 mg/dL   Calcium 9.9 8.9 - 10.3 mg/dL   Total Protein 8.6 (H) 6.5 - 8.1 g/dL   Albumin 4.9 3.5 - 5.0 g/dL   AST 23 15 - 41 U/L   ALT 14 0 - 44 U/L    Comment: Please note change in reference range.   Alkaline Phosphatase 43 38 - 126 U/L   Total Bilirubin 0.8 0.3 - 1.2 mg/dL   GFR calc non Af Amer 56 (L) >60 mL/min   GFR calc Af Amer >60 >60 mL/min    Comment: (NOTE) The eGFR has been calculated using the CKD EPI equation. This calculation has not been validated in all clinical situations. eGFR's persistently <60 mL/min signify possible Chronic Kidney Disease.    Anion gap 11 5 - 15    Comment: Performed at [facility], [place] [address] [place], [place] [address]  Ethanol     Status: None   Collection Time: 01/18/18  5:02 PM  Result Value Ref Range   Alcohol, Ethyl (B) <10 <10 mg/dL    Comment: (NOTE) Lowest detectable limit for serum alcohol is 10 mg/dL. For medical  purposes only. Performed at [facility], [place] [address] [place], [place] [address]   Salicylate level     Status: None   Collection Time: 01/18/18  5:02 PM  Result Value Ref Range   Salicylate Lvl <8.5 2.8 - 30.0 mg/dL    Comment: Performed at [facility], [place] [address] [place], [place] [address]  Acetaminophen level     Status: Abnormal  Collection Time: 01/18/18  5:02 PM  Result Value Ref Range   Acetaminophen (Tylenol), Serum <10 (L) 10 - 30 ug/mL    Comment: (NOTE) Therapeutic concentrations vary significantly. A range of 10-30 ug/mL  may be an effective concentration for many patients. However, some  are best treated at concentrations outside of this range. Acetaminophen concentrations >150 ug/mL at 4 hours after ingestion  and >50 ug/mL at 12 hours after ingestion are often associated with  toxic reactions. Performed at [facility], [place] [address], [place], [place] [address]   cbc     Status: None   Collection Time: 01/18/18  5:02 PM  Result Value Ref Range   WBC 6.3 4.0 - 10.5 K/uL   RBC 4.35 3.87 - 5.11 MIL/uL   Hemoglobin 14.1 12.0 - 15.0 g/dL   HCT 40.9 36.0 - 46.0 %   MCV 94.0 78.0 - 100.0 fL   MCH 32.4 26.0 - 34.0 pg   MCHC 34.5 30.0 - 36.0 g/dL   RDW 12.2 11.5 - 15.5 %   Platelets 325 150 - 400 K/uL    Comment: Performed at [facility], [place] [address] [place], [place] [address]  Rapid urine drug screen (hospital performed)     Status: Abnormal   Collection Time: 01/18/18  5:27 PM  Result Value Ref Range   Opiates NONE DETECTED NONE DETECTED   Cocaine NONE DETECTED NONE DETECTED   Benzodiazepines NONE DETECTED NONE DETECTED   Amphetamines NONE DETECTED NONE DETECTED   Tetrahydrocannabinol POSITIVE (A) NONE DETECTED   Barbiturates (A) NONE DETECTED    Result not available. Reagent lot number recalled by manufacturer.    Comment: (NOTE) DRUG SCREEN FOR MEDICAL PURPOSES ONLY.   IF CONFIRMATION IS NEEDED FOR ANY PURPOSE, NOTIFY LAB WITHIN 5 DAYS. LOWEST DETECTABLE LIMITS FOR URINE DRUG SCREEN Drug Class                     Cutoff (ng/mL) Amphetamine and metabolites    1000 Barbiturate and metabolites    200 Benzodiazepine                 361 Tricyclics and metabolites     300 Opiates and metabolites        300 Cocaine and metabolites        300 THC                            50 Performed at [facility], [place] [address] [place], [place] [address]   I-Stat beta hCG blood, ED     Status: None   Collection Time: 01/18/18  5:31 PM  Result Value Ref Range   I-stat hCG, quantitative <5.0 <5 mIU/mL   Comment 3            Comment:   GEST. AGE      CONC.  (mIU/mL)   <=1 WEEK        5 - 50     2 WEEKS       50 - 500     3 WEEKS       100 - 10,000     4 WEEKS     1,000 - 30,000        FEMALE AND NON-PREGNANT FEMALE:     LESS THAN 5 mIU/mL   Urinalysis, Routine w reflex microscopic     Status: Abnormal   Collection Time:  01/18/18  6:06 PM  Result Value Ref Range   Color, Urine AMBER (A) YELLOW    Comment: BIOCHEMICALS MAY BE AFFECTED BY COLOR   APPearance HAZY (A) CLEAR   Specific Gravity, Urine 1.028 1.005 - 1.030   pH 5.0 5.0 - 8.0   Glucose, UA NEGATIVE NEGATIVE mg/dL   Hgb urine dipstick NEGATIVE NEGATIVE   Bilirubin Urine SMALL (A) NEGATIVE   Ketones, ur 20 (A) NEGATIVE mg/dL   Protein, ur 100 (A) NEGATIVE mg/dL   Nitrite NEGATIVE NEGATIVE   Leukocytes, UA NEGATIVE NEGATIVE   RBC / HPF 0-5 0 - 5 RBC/hpf   WBC, UA 0-5 0 - 5 WBC/hpf   Bacteria, UA RARE (A) NONE SEEN   Squamous Epithelial / LPF 0-5 0 - 5   Mucus PRESENT    Trichomonas, UA PRESENT (A) NONE SEEN   Hyaline Casts, UA PRESENT    Granular Casts, UA PRESENT     Comment: Performed at [facility], [place] [address] [place], [place] [address]  Vitamin B12     Status: None   Collection Time: 01/18/18  8:24 PM  Result Value Ref Range   Vitamin B-12 326  180 - 914 pg/mL    Comment: (NOTE) This assay is not validated for testing neonatal or myeloproliferative syndrome specimens for Vitamin B12 levels. Performed at [facility], [place] [address] [place], [place] [address]   HIV antibody     Status: None   Collection Time: 01/18/18  8:24 PM  Result Value Ref Range   HIV Screen 4th Generation wRfx Non Reactive Non Reactive    Comment: (NOTE) Performed At: [facility] [place], [place] [id number] Rush Farmer MD [id number] Performed at [facility], [place] [address] [place], [place] [address]   RPR     Status: None   Collection Time: 01/18/18  8:24 PM  Result Value Ref Range   RPR Ser Ql Non Reactive Non Reactive    Comment: (NOTE) Performed At: [facility] [place], [place] [id number] Rush Farmer MD [id number] Performed at [facility], [place] [address], [place], [place] [address]   Wet prep, genital     Status: Abnormal   Collection Time: 01/18/18  9:04 PM  Result Value Ref Range   Yeast Wet Prep HPF POC NONE SEEN NONE SEEN   Trich, Wet Prep PRESENT (A) NONE SEEN   Clue Cells Wet Prep HPF POC PRESENT (A) NONE SEEN   WBC, Wet Prep HPF POC MANY (A) NONE SEEN   Sperm NONE SEEN     Comment: Performed at [facility], [place] [address] [place], [place] [address]    Current Facility-Administered Medications  Medication Dose Route Frequency Provider Last Rate Last Dose  . acetaminophen (TYLENOL) tablet 650 mg  650 mg Oral Q4H PRN Jacqlyn Larsen, PA-C      . OLANZapine zydis (ZYPREXA) disintegrating tablet 5 mg  5 mg Oral Daily Faythe Dingwall, DO   5 mg at 01/19/18 1347   No current outpatient medications on file.    Musculoskeletal: Strength & Muscle Tone: within normal limits Gait & Station: normal Patient leans: N/A  Psychiatric Specialty Exam: Physical Exam  Nursing note and vitals  reviewed. Constitutional: She is oriented to person, place, and time. She appears well-developed and well-nourished.  HENT:  Head: Normocephalic and atraumatic.  Neck: Normal range of motion.  Respiratory: Effort normal.  Musculoskeletal: Normal range  of motion.  Neurological: She is alert and oriented to person, place, and time.  Psychiatric: Her speech is normal. Her mood appears anxious. She is agitated. Thought content is paranoid and delusional. Cognition and memory are impaired. She expresses impulsivity. She exhibits a depressed mood.    Review of Systems  Psychiatric/Behavioral: Positive for depression, hallucinations and substance abuse. Negative for memory loss and suicidal ideas. The patient is not nervous/anxious and does not have insomnia.   All other systems reviewed and are negative.   Blood pressure 132/79, pulse (!) 50, temperature 98.1 F (36.7 C), temperature source Oral, resp. rate 16, SpO2 100 %.There is no height or weight on file to calculate BMI.  General Appearance: Casual  Eye Contact:  Poor  Speech:  Clear and Coherent  Volume:  Decreased  Mood:  Anxious, Depressed and Irritable  Affect:  Depressed and Flat  Thought Process:  Disorganized and Descriptions of Associations: Loose  Orientation:  Full (Time, Place, and Person)  Thought Content:  Illogical and Hallucinations: Auditory  Suicidal Thoughts:  No  Homicidal Thoughts:  No  Memory:  Immediate;   Fair Recent;   Fair Remote;   Fair  Judgement:  Poor  Insight:  Shallow  Psychomotor Activity:  Decreased  Concentration:  Concentration: Fair and Attention Span: Fair  Recall:  [employer] of Knowledge:  Fair  Language:  Good  Akathisia:  No  Handed:  Right  AIMS (if indicated):   N/A  Assets:  Communication Skills Housing  ADL's:  Intact  Cognition:  Impaired due to psychiatric condition.   Sleep:   Poor     Treatment Plan Summary: Daily contact with patient to assess and evaluate symptoms and  progress in treatment and Medication management (see MAR )  Disposition: Recommend psychiatric Inpatient admission when medically cleared. Pt has been accepted to [facility], bed 507-1  Ethelene Hal, NP 01/19/2018 2:53 PM   Patient seen face-to-face for psychiatric evaluation, chart reviewed and case discussed with the physician extender and developed treatment plan. Reviewed the information documented and agree with the treatment plan.  Buford Dresser, DO 01/19/18 8:14 PM

## 2018-01-19 NOTE — ED Notes (Signed)
[facility]  Police called for transport.

## 2018-01-19 NOTE — BH Assessment (Signed)
[facility]  Per Juanetta BeetsJacqueline Norman, DO, this pt requires psychiatric hospitalization.  Malva LimesLinsey Strader, RN, [facility]; she can be transported after she receives medication.  Pt presents under IVC initiated by law enforcement, and upheld by Dr Sharma CovertNorman, and [facility].  Pt's nurse, Aram BeechamCynthia, has been notified, and agrees to call report to [phone].  Pt is to be transported via [employer] examinerlaw enforcement.   Doylene Canninghomas Cadan Maggart, [place] Behavioral Health Coordinator [phone]

## 2018-01-19 NOTE — Tx Team (Signed)
Initial Treatment Plan 01/19/2018 5:29 PM Erin Christensen [id number]    PATIENT STRESSORS: Marital or family conflict Substance abuse   PATIENT STRENGTHS: Ability for insight Average or above average intelligence General fund of knowledge   PATIENT IDENTIFIED PROBLEMS: Psychosis "Don't you know why I'm here, read the paper"                     DISCHARGE CRITERIA:  Ability to meet basic life and health needs Improved stabilization in mood, thinking, and/or behavior Verbal commitment to aftercare and medication compliance  PRELIMINARY DISCHARGE PLAN: Attend aftercare/continuing care group Return to previous living arrangement  PATIENT/FAMILY INVOLVEMENT: This treatment plan has been presented to and reviewed with the patient, Erin LinKeydysha Christensen, and/or family member, .  The patient and family have been given the opportunity to ask questions and make suggestions.  Dmarco Baldus, [place], [place]RN 01/19/2018, 5:29 PM

## 2018-01-19 NOTE — BH Assessment (Signed)
[facility]   Mother is Sabas Souseresa West and can be contacted at [phone].

## 2018-01-19 NOTE — Progress Notes (Signed)
Erin Christensen is a 22 year old female pt admitted on involuntary basis. On admission, she was irritable and minimally cooperative. When asked why she was here, she simply responded "read the paper". She denies any SI and is able to contract for safety on the unit. She denies any substance abuse issues and denies that she is on any medications. She denies any auditory hallucinations. She denies any health problems. She reports that she lives with roommates and reports that she can go back there after discharge. She was oriented to the unit and safety maintained.

## 2018-01-19 NOTE — ED Notes (Signed)
Bed: WBH41 Expected date:  Expected time:  Means of arrival:  Comments: Room 3 

## 2018-01-19 NOTE — ED Notes (Signed)
Patient in room.  Appears to be responding to internal stimuli.  Disorganized in her thoughts and speech.  Calm and cooperative.

## 2018-01-19 NOTE — ED Notes (Signed)
Pt in hallway & attempted to open doors to TCU.  Pt redirected back to room.

## 2018-01-20 DIAGNOSIS — F22 Delusional disorders: Secondary | ICD-10-CM

## 2018-01-20 DIAGNOSIS — F1290 Cannabis use, unspecified, uncomplicated: Secondary | ICD-10-CM

## 2018-01-20 DIAGNOSIS — F29 Unspecified psychosis not due to a substance or known physiological condition: Secondary | ICD-10-CM

## 2018-01-20 MED ORDER — LORAZEPAM 2 MG/ML IJ SOLN
2.0000 mg | Freq: Once | INTRAMUSCULAR | Status: AC
  Administered 2018-01-20: 2 mg via INTRAMUSCULAR
  Filled 2018-01-20: qty 1

## 2018-01-20 MED ORDER — HALOPERIDOL LACTATE 5 MG/ML IJ SOLN
5.0000 mg | Freq: Once | INTRAMUSCULAR | Status: AC
  Administered 2018-01-20: 5 mg via INTRAMUSCULAR
  Filled 2018-01-20 (×2): qty 1

## 2018-01-20 NOTE — BHH Suicide Risk Assessment (Signed)
[facility] Admission Suicide Risk Assessment   Nursing information obtained from:  Patient Demographic factors:  Adolescent or young adult, Low socioeconomic status Current Mental Status:  NA Loss Factors:  NA Historical Factors:  Family history of mental illness or substance abuse Risk Reduction Factors:  Positive coping skills or problem solving skills  Total Time spent with patient: 45 minutes Principal Problem: Psychosis, paranoid (HCC) Diagnosis:   Patient Active Problem List   Diagnosis Date Noted  . Marijuana use [F12.90] 01/20/2018  . 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 Psychosis (HCC) [F29]   . Trichomonal vaginitis [A59.01]    History of Present Illness: Erin Christensen a 22 y.o.femalepatient admitted with acute psychosis. Pt was seen and chart reviewed with treatment team and Dr Sharma Covert.Pt was brought to the [facility] under IVC after she was found at home staring into space and mumbling to herself. Pt had also been entering into other peoples apartments and telling them she was God. During assessment today she was sitting in the chair in her room, guarded, and responding to internal stimuli. Per chart review Pt has no mental health history in this area. Pt was able to state why she was in the hospital but is guardedwiththought blocking. Pt refused to answer any further questions. Labs and testing were reviewed. Pt was found to have trichomonas on admission and was treated with IM Rocephin. Pt's UDS positive for THC, BAL negative. Pt was started on Zyprexa for mood stabilization while in the [facility] and has been calm and cooperative and spending most of her time in her room. Pt would benefit form an inpatient psychiatric admission for crisis stabilization and medication management.  On initial evaluation today, patient avoids interview.  She states, "I am God".  She reports that the police hurt her when they brought her in, and says, "that is not fine.".  She denies AVH, again  reporting "I am God". She does appear to be responding to internal stimuli.  She denies SI/HI.  She refuses to answer other questions. She has been present in the dayroom, but not interacting with peers.  She has taken multiple showers during the day, and keeps her hair wrapped on top of head with towel, even when in dayroom.  She refuses medications or to sign consent for SW or treatment team to get collateral information. She does endorse that she feels safe on the unit, but thinks she is ready to be discharged.  A second opinion is completed for IVC.    Continued Clinical Symptoms:  Alcohol Use Disorder Identification Test Final Score (AUDIT): 1 The "Alcohol Use Disorders Identification Test", Guidelines for Use in Primary Care, Second Edition.  World Science writer [facility]). Score between 0-7:  no or low risk or alcohol related problems. Score between 8-15:  moderate risk of alcohol related problems. Score between 16-19:  high risk of alcohol related problems. Score 20 or above:  warrants further diagnostic evaluation for alcohol dependence and treatment.   CLINICAL FACTORS:   Alcohol/Substance Abuse/Dependencies Currently Psychotic   Musculoskeletal: Strength & Muscle Tone: within normal limits Gait & Station: normal Patient leans: N/A   Psychiatric Specialty Exam: Physical Exam  Nursing note and vitals reviewed. Constitutional: She appears well-developed and well-nourished. No distress.  Psychiatric:  Calm, but uncooperative     Review of Systems  Constitutional: Negative.   Neurological: Negative.   Psychiatric/Behavioral: Positive for hallucinations and substance abuse. Negative for depression and suicidal ideas. The patient is not nervous/anxious and does not have  insomnia.     Blood pressure 131/81, pulse 76, temperature 98 F (36.7 C), temperature source Oral, resp. rate 18, height 5\' 7"  (1.702 m), weight 81.6 kg (180 lb).Body mass index is 28.19 kg/m.  General  Appearance: Bizarre and keeps towel on head from frequent showering  Eye Contact:  Minimal  Speech:  Blocked and Slow  Volume:  Decreased  Mood:  reports "fine"  Affect:  Negative  Thought Process:  Goal Directed to be discharged  Orientation:  Full (Time, Place, and Person)  Thought Content:  Delusions and Hallucinations: appears to be responding to internal stimuli  Suicidal Thoughts:  No  Homicidal Thoughts:  No  Memory:  Unable to assess  Judgement:  Other:  Unable to assess  Insight:  Lacking  Psychomotor Activity:  Normal  Concentration:  Concentration: Fair and Attention Span: Fair  Recall:  Unable to assess  Fund of Knowledge:  Unable to assess  Language:  Good  Akathisia:  No  Handed:  AIMS (if indicated):     Assets:  Others:  unknown  ADL's:  Intact  Cognition:  WNL  Sleep:  Number of Hours: 5.5    COGNITIVE FEATURES THAT CONTRIBUTE TO RISK:  Thought constriction (tunnel vision)    SUICIDE RISK:   Mild:  Suicidal ideation of limited frequency, intensity, duration, and specificity.  There are no identifiable plans, no associated intent, mild dysphoria and related symptoms, good self-control (both objective and subjective assessment), few other risk factors, and identifiable protective factors, including available and accessible social support.  PLAN OF CARE: Treatment Plan Summary: Daily contact with patient to assess and evaluate symptoms and progress in treatment and Medication management  Observation Level/Precautions:  15 minute checks  Laboratory:  CBC Chemistry Profile HbAIC UDS Lipid profile  Psychotherapy:  Encourage group attendance on unit.  Medications:  PRN medications available  Consultations:  N/A  Discharge Concerns:  Need collateral   Estimated LOS: 5-7 days  Other:  Allow THC to clear.   Physician Treatment Plan for Primary Diagnosis: Psychosis, paranoid (HCC) Long Term Goal(s): Improvement in symptoms so as ready for  discharge  Short Term Goals: Ability to identify changes in lifestyle to reduce recurrence of condition will improve and Ability to maintain clinical measurements within normal limits will improve  Physician Treatment Plan for Secondary Diagnosis: Principal Problem:   Psychosis, paranoid (HCC) Active Problems:   Marijuana use  Long Term Goal(s): Improvement in symptoms so as ready for discharge  Short Term Goals: Ability to identify changes in lifestyle to reduce recurrence of condition will improve, Ability to maintain clinical measurements within normal limits will improve and Compliance with prescribed medications will improve   I certify that inpatient services furnished can reasonably be expected to improve the patient's condition.   Mariel CraftSHEILA M Delta Deshmukh, MD 01/20/2018, 5:12 PM

## 2018-01-20 NOTE — Progress Notes (Signed)
Pt was religiously preoccupied while on the unit.  She was yelling "I am God," pacing and difficult to redirect during visitation hours.  She was irritable and argumentative.  Orders obtained for injection and show of force present.  She did agree to take the medication IM.  We will continue to monitor her progress.

## 2018-01-20 NOTE — BHH Group Notes (Signed)
Pt did not attend wrap up group this evening. Pt stayed in their room instead 

## 2018-01-20 NOTE — H&P (Signed)
Psychiatric Admission Assessment Adult  Patient Identification: Erin Christensen MRN:  [id number] Date of Evaluation:  01/20/2018 Chief Complaint:  BRIEF PSYCHOTIC DISORDER Principal Diagnosis: Psychosis, paranoid ([place]) Diagnosis:   Patient Active Problem List   Diagnosis Date Noted  . Marijuana use [F12.90] 01/20/2018  . Psychosis, paranoid ([place]) [F22] 01/19/2018  . Psychosis ([place]) [F29]   . Trichomonal vaginitis [A59.01]    History of Present Illness: Erin Christensen is a 22 y.o. female patient admitted with acute psychosis. Pt was seen and chart reviewed with treatment team and Dr Mariea Clonts. Pt was brought to the [facility] under IVC after she was found at home staring into space and mumbling to herself. Pt had also been entering into other peoples apartments and telling them she was God. During assessment today she was sitting in the chair in her room, guarded, and responding to internal stimuli. Per chart review Pt has no mental health history in this area. Pt was able to state why she was in the hospital but is guarded with thought blocking. Pt refused to answer any further questions. Labs and testing were reviewed. Pt was found to have trichomonas on admission and was treated with IM Rocephin. Pt's UDS positive for THC, BAL negative. Pt was started on Zyprexa for mood stabilization while in the [facility] and has been calm and cooperative and spending most of her time in her room. Pt would benefit form an inpatient psychiatric admission for crisis stabilization and medication management.   On initial evaluation today, patient avoids interview.  She states, "I am God".  She reports that the police hurt her when they brought her in, and says, "that is not fine.".  She denies AVH, again reporting "I am God". She does appear to be responding to internal stimuli.  She denies SI/HI.  She refuses to answer other questions. She has been present in the dayroom, but not interacting with peers.  She has taken multiple  showers during the day, and keeps her hair wrapped on top of head with towel, even when in dayroom.  She refuses medications or to sign consent for SW or treatment team to get collateral information. She does endorse that she feels safe on the unit, but thinks she is ready to be discharged.  A second opinion is completed for IVC.  Associated Signs/Symptoms: Depression Symptoms:  denies or refuses to answer (Hypo) Manic Symptoms:  denies or refuses to answer Anxiety Symptoms:  denies or refuses to answer Psychotic Symptoms:  Delusions, Hallucinations: denies, however appears to be responding to internal stimuli PTSD Symptoms: denies or refuses to answer Total Time spent with patient: 45 minutes  Past Psychiatric History: denies or refuses to answer  Is the patient at risk to self? No.  Has the patient been a risk to self in the past 6 months? No.  Has the patient been a risk to self within the distant past? No.  Is the patient a risk to others? No.  Has the patient been a risk to others in the past 6 months? No.  Has the patient been a risk to others within the distant past? No.   Prior Inpatient Therapy:   denies or refuses to answer Prior Outpatient Therapy:   denies or refuses to answer  Alcohol Screening: 1. How often do you have a drink containing alcohol?: Monthly or less 2. How many drinks containing alcohol do you have on a typical day when you are drinking?: 1 or 2 3. How often do you have  six or more drinks on one occasion?: Never AUDIT-C Score: 1 4. How often during the last year have you found that you were not able to stop drinking once you had started?: Never 5. How often during the last year have you failed to do what was normally expected from you becasue of drinking?: Never 6. How often during the last year have you needed a first drink in the morning to get yourself going after a heavy drinking session?: Never 7. How often during the last year have you had a feeling of  guilt of remorse after drinking?: Never 8. How often during the last year have you been unable to remember what happened the night before because you had been drinking?: Never 9. Have you or someone else been injured as a result of your drinking?: No 10. Has a relative or friend or a doctor or another health worker been concerned about your drinking or suggested you cut down?: No Alcohol Use Disorder Identification Test Final Score (AUDIT): 1 Intervention/Follow-up: AUDIT Score <7 follow-up not indicated Substance Abuse History in the last 12 months:  Yes.   UDS positive for THC Consequences of Substance Abuse: psychiatric hospitalization Previous Psychotropic Medications: denies or refuses to answer Psychological Evaluations: denies or refuses to answer Past Medical History:  Past Medical History:  Diagnosis Date  . Generalized headaches    History reviewed. No pertinent surgical history. Family History:  Family History  Problem Relation Age of Onset  . Colon cancer Neg Hx   . Stomach cancer Neg Hx   . Esophageal cancer Neg Hx    Family Psychiatric  History: denies or refuses to answer Tobacco Screening: Have you used any form of tobacco in the last 30 days? (Cigarettes, Smokeless Tobacco, Cigars, and/or Pipes): No Social History:  Social History   Substance and Sexual Activity  Alcohol Use Yes   Comment: social     Social History   Substance and Sexual Activity  Drug Use No    Additional Social History: Are you sexually active?: No What is your sexual orientation?: straight Does patient have children?: No       Does not give consent for collateral information to be obtained.                   Allergies:  No Known Allergies Lab Results:  Results for orders placed or performed during the hospital encounter of 01/18/18 (from the past 48 hour(s))  Comprehensive metabolic panel     Status: Abnormal   Collection Time: 01/18/18  5:02 PM  Result Value Ref Range    Sodium 143 135 - 145 mmol/L   Potassium 3.7 3.5 - 5.1 mmol/L   Chloride 108 98 - 111 mmol/L    Comment: Please note change in reference range.   CO2 24 22 - 32 mmol/L   Glucose, Bld 120 (H) 70 - 99 mg/dL    Comment: Please note change in reference range.   BUN 26 (H) 6 - 20 mg/dL    Comment: Please note change in reference range.   Creatinine, Ser 1.32 (H) 0.44 - 1.00 mg/dL   Calcium 9.9 8.9 - 10.3 mg/dL   Total Protein 8.6 (H) 6.5 - 8.1 g/dL   Albumin 4.9 3.5 - 5.0 g/dL   AST 23 15 - 41 U/L   ALT 14 0 - 44 U/L    Comment: Please note change in reference range.   Alkaline Phosphatase 43 38 - 126 U/L   Total  Bilirubin 0.8 0.3 - 1.2 mg/dL   GFR calc non Af Amer 56 (L) >60 mL/min   GFR calc Af Amer >60 >60 mL/min    Comment: (NOTE) The eGFR has been calculated using the CKD EPI equation. This calculation has not been validated in all clinical situations. eGFR's persistently <60 mL/min signify possible Chronic Kidney Disease.    Anion gap 11 5 - 15    Comment: Performed at [facility], [place] [address], [place], [place] [address]  Ethanol     Status: None   Collection Time: 01/18/18  5:02 PM  Result Value Ref Range   Alcohol, Ethyl (B) <10 <10 mg/dL    Comment: (NOTE) Lowest detectable limit for serum alcohol is 10 mg/dL. For medical purposes only. Performed at [facility], [place] [address] [place], [place] [address]   Salicylate level     Status: None   Collection Time: 01/18/18  5:02 PM  Result Value Ref Range   Salicylate Lvl <2.9 2.8 - 30.0 mg/dL    Comment: Performed at [facility], [place] [address], [place], [place] [address]  Acetaminophen level     Status: Abnormal   Collection Time: 01/18/18  5:02 PM  Result Value Ref Range   Acetaminophen (Tylenol), Serum <10 (L) 10 - 30 ug/mL    Comment: (NOTE) Therapeutic concentrations vary significantly. A range of 10-30 ug/mL  may be an effective concentration for many  patients. However, some  are best treated at concentrations outside of this range. Acetaminophen concentrations >150 ug/mL at 4 hours after ingestion  and >50 ug/mL at 12 hours after ingestion are often associated with  toxic reactions. Performed at [facility], [place] [address], [place], [place] [address]   cbc     Status: None   Collection Time: 01/18/18  5:02 PM  Result Value Ref Range   WBC 6.3 4.0 - 10.5 K/uL   RBC 4.35 3.87 - 5.11 MIL/uL   Hemoglobin 14.1 12.0 - 15.0 g/dL   HCT 40.9 36.0 - 46.0 %   MCV 94.0 78.0 - 100.0 fL   MCH 32.4 26.0 - 34.0 pg   MCHC 34.5 30.0 - 36.0 g/dL   RDW 12.2 11.5 - 15.5 %   Platelets 325 150 - 400 K/uL    Comment: Performed at [facility], [place] [address] [place], [place] [address]  Rapid urine drug screen (hospital performed)     Status: Abnormal   Collection Time: 01/18/18  5:27 PM  Result Value Ref Range   Opiates NONE DETECTED NONE DETECTED   Cocaine NONE DETECTED NONE DETECTED   Benzodiazepines NONE DETECTED NONE DETECTED   Amphetamines NONE DETECTED NONE DETECTED   Tetrahydrocannabinol POSITIVE (A) NONE DETECTED   Barbiturates (A) NONE DETECTED    Result not available. Reagent lot number recalled by manufacturer.    Comment: (NOTE) DRUG SCREEN FOR MEDICAL PURPOSES ONLY.  IF CONFIRMATION IS NEEDED FOR ANY PURPOSE, NOTIFY LAB WITHIN 5 DAYS. LOWEST DETECTABLE LIMITS FOR URINE DRUG SCREEN Drug Class                     Cutoff (ng/mL) Amphetamine and metabolites    1000 Barbiturate and metabolites    200 Benzodiazepine                 751 Tricyclics and metabolites     300 Opiates and metabolites        300 Cocaine and  metabolites        300 THC                            50 Performed at [facility], [place] [address], [place], [place] [address]   I-Stat beta hCG blood, ED     Status: None   Collection Time: 01/18/18  5:31 PM  Result Value Ref Range   I-stat hCG,  quantitative <5.0 <5 mIU/mL   Comment 3            Comment:   GEST. AGE      CONC.  (mIU/mL)   <=1 WEEK        5 - 50     2 WEEKS       50 - 500     3 WEEKS       100 - 10,000     4 WEEKS     1,000 - 30,000        FEMALE AND NON-PREGNANT FEMALE:     LESS THAN 5 mIU/mL   Urinalysis, Routine w reflex microscopic     Status: Abnormal   Collection Time: 01/18/18  6:06 PM  Result Value Ref Range   Color, Urine AMBER (A) YELLOW    Comment: BIOCHEMICALS MAY BE AFFECTED BY COLOR   APPearance HAZY (A) CLEAR   Specific Gravity, Urine 1.028 1.005 - 1.030   pH 5.0 5.0 - 8.0   Glucose, UA NEGATIVE NEGATIVE mg/dL   Hgb urine dipstick NEGATIVE NEGATIVE   Bilirubin Urine SMALL (A) NEGATIVE   Ketones, ur 20 (A) NEGATIVE mg/dL   Protein, ur 100 (A) NEGATIVE mg/dL   Nitrite NEGATIVE NEGATIVE   Leukocytes, UA NEGATIVE NEGATIVE   RBC / HPF 0-5 0 - 5 RBC/hpf   WBC, UA 0-5 0 - 5 WBC/hpf   Bacteria, UA RARE (A) NONE SEEN   Squamous Epithelial / LPF 0-5 0 - 5   Mucus PRESENT    Trichomonas, UA PRESENT (A) NONE SEEN   Hyaline Casts, UA PRESENT    Granular Casts, UA PRESENT     Comment: Performed at [facility], [place] [address], [place], [place] [address]  Vitamin B12     Status: None   Collection Time: 01/18/18  8:24 PM  Result Value Ref Range   Vitamin B-12 326 180 - 914 pg/mL    Comment: (NOTE) This assay is not validated for testing neonatal or myeloproliferative syndrome specimens for Vitamin B12 levels. Performed at [facility], [place] [address] [place], [place] [address]   HIV antibody     Status: None   Collection Time: 01/18/18  8:24 PM  Result Value Ref Range   HIV Screen 4th Generation wRfx Non Reactive Non Reactive    Comment: (NOTE) Performed At: [facility] [place], [place] [id number] Rush Farmer MD [id number] Performed at [facility], [place] [address] [place], [place] [address]   RPR      Status: None   Collection Time: 01/18/18  8:24 PM  Result Value Ref Range   RPR Ser Ql Non Reactive Non Reactive    Comment: (NOTE) Performed At: [facility] [place], [place] [id number] Rush Farmer MD [id number] Performed at [facility], [place] [address], [place],  [address]   Wet prep, genital     Status: Abnormal   Collection Time: 01/18/18  9:04 PM  Result Value Ref Range   Yeast Wet Prep HPF POC NONE SEEN NONE SEEN   Trich, Wet Prep PRESENT (A) NONE SEEN   Clue Cells Wet Prep HPF POC PRESENT (A) NONE SEEN   WBC, Wet Prep HPF POC MANY (A) NONE SEEN   Sperm NONE SEEN     Comment: Performed at [facility], [place] [address] [place], [place] [address]    Blood Alcohol level:  Lab Results  Component Value Date   ETH <10 52/77/8242    Metabolic Disorder Labs:  No results found for: HGBA1C, MPG No results found for: PROLACTIN No results found for: CHOL, TRIG, HDL, CHOLHDL, VLDL, LDLCALC  Current Medications: Current Facility-Administered Medications  Medication Dose Route Frequency Provider Last Rate Last Dose  . acetaminophen (TYLENOL) tablet 650 mg  650 mg Oral Q6H PRN Ethelene Hal, NP      . acetaminophen (TYLENOL) tablet 650 mg  650 mg Oral Q4H PRN Ethelene Hal, NP      . alum & mag hydroxide-simeth (MAALOX/MYLANTA) 200-200-20 MG/5ML suspension 30 mL  30 mL Oral Q4H PRN Ethelene Hal, NP      . hydrOXYzine (ATARAX/VISTARIL) tablet 25 mg  25 mg Oral TID PRN Ethelene Hal, NP      . LORazepam (ATIVAN) tablet 1 mg  1 mg Oral Q6H PRN Niel Hummer, NP   1 mg at 01/19/18 1856   Or  . LORazepam (ATIVAN) injection 1 mg  1 mg Intramuscular Q6H PRN Elmarie Shiley A, NP   1 mg at 01/19/18 1939  . LORazepam (ATIVAN) injection 2 mg  2 mg Intramuscular STAT Elmarie Shiley A, NP      . magnesium hydroxide (MILK OF MAGNESIA) suspension 30 mL  30 mL Oral Daily PRN Ethelene Hal, NP      . OLANZapine zydis (ZYPREXA) disintegrating tablet 5 mg  5 mg Oral Daily Ethelene Hal, NP      . traZODone (DESYREL) tablet 50 mg  50 mg Oral QHS PRN Ethelene Hal, NP       PTA Medications: No medications prior to admission.    Musculoskeletal: Strength & Muscle Tone: within normal limits Gait & Station: normal Patient leans: N/A  Psychiatric Specialty Exam: Physical Exam  Nursing note and vitals reviewed. Constitutional: She appears well-developed and well-nourished. No distress.  Psychiatric:  Calm, but uncooperative     Review of Systems  Constitutional: Negative.   Neurological: Negative.   Psychiatric/Behavioral: Positive for hallucinations and substance abuse. Negative for depression and suicidal ideas. The patient is not nervous/anxious and does not have insomnia.     Blood pressure 131/81, pulse 76, temperature 98 F (36.7 C), temperature source Oral, resp. rate 18, height 5' 7"  (1.702 m), weight 81.6 kg (180 lb).Body mass index is 28.19 kg/m.  General Appearance: Bizarre and keeps towel on head from frequent showering  Eye Contact:  Minimal  Speech:  Blocked and Slow  Volume:  Decreased  Mood:  reports "fine"  Affect:  Negative  Thought Process:  Goal Directed to be discharged  Orientation:  Full (Time, Place, and Person)  Thought Content:  Delusions and Hallucinations: appears to be responding to internal stimuli  Suicidal Thoughts:  No  Homicidal Thoughts:  No  Memory:  Unable to assess  Judgement:  Other:  Unable to assess  Insight:  Lacking  Psychomotor Activity:  Normal  Concentration:  Concentration: Fair and Attention Span: Fair  Recall:  Unable  to assess  Fund of Knowledge:  Unable to assess  Language:  Good  Akathisia:  No  Handed:  AIMS (if indicated):     Assets:  Others:  unknown  ADL's:  Intact  Cognition:  WNL  Sleep:  Number of Hours: 5.5    Treatment Plan Summary: Daily contact with patient to assess  and evaluate symptoms and progress in treatment and Medication management  Observation Level/Precautions:  15 minute checks  Laboratory:  CBC Chemistry Profile HbAIC UDS Lipid profile  Psychotherapy:  Encourage group attendance on unit.  Medications:  PRN medications available  Consultations:  N/A  Discharge Concerns:  Need collateral   Estimated LOS: 5-7 days  Other:  Allow THC to clear.   Physician Treatment Plan for Primary Diagnosis: Psychosis, paranoid ([place]) Long Term Goal(s): Improvement in symptoms so as ready for discharge  Short Term Goals: Ability to identify changes in lifestyle to reduce recurrence of condition will improve and Ability to maintain clinical measurements within normal limits will improve  Physician Treatment Plan for Secondary Diagnosis: Principal Problem:   Psychosis, paranoid ([place]) Active Problems:   Marijuana use  Long Term Goal(s): Improvement in symptoms so as ready for discharge  Short Term Goals: Ability to identify changes in lifestyle to reduce recurrence of condition will improve, Ability to maintain clinical measurements within normal limits will improve and Compliance with prescribed medications will improve  I certify that inpatient services furnished can reasonably be expected to improve the patient's condition.    Lavella Hammock, MD 7/9/20194:56 PM

## 2018-01-20 NOTE — Progress Notes (Signed)
Patient denies SI, Hi and AVH.  Patient has needed much redirection due to boundary issues.  Assess patient for safety, offer medications as prescribed, engage patient in 1:1 staff talks.   Continue to monitor as planned. Patient able to contract for safety.

## 2018-01-20 NOTE — Progress Notes (Signed)
Pt stated she was not hearing voices, but when asked who she was talking to she responded " demons"

## 2018-01-20 NOTE — Progress Notes (Signed)
D:  As RN arrived on the unit, Erin Christensen was noted pacing the floors repeating "I am God" after receiving IM Ativan and Geodon for agitation.  She made no eye contact, went to her room and fell asleep.  She is currently resting with her eyes closed.  Breathing even and unlabored.   A:  Q 15 minute checks maintained for safety.   R:  Erin Christensen remains safe on the unit.  We will continue to monitor the progress towards her goals.

## 2018-01-20 NOTE — BHH Counselor (Signed)
Adult Comprehensive Assessment  Patient ID: Erin Christensen, female   DOB: 1995-09-26, 22 y.o.   MRN: [id number]  Information Source: Information source: Patient(Most info. from mother, Sabas S[place] at [phone])  Current Stressors:  Patient states their primary concerns and needs for treatment are:: Spiritual warfare between good and evil Patient states their goals for this hospitilization and ongoing recovery are:: "I am God" Educational / Learning stressors: Dropped out of college Employment / Job issues: Has been working for 6 months at hotel Family Relationships: Mother, sister and granmother are all supportive Social relationships: Tends to be home body-has 2 close friends Substance abuse: N/A  Living/Environment/Situation:  Living Arrangements: Other (Comment)(roommates) Living conditions (as described by patient or guardian): good-lives in an apartment in [place] Who else lives in the home?: 3 roomates-1 from before-2 new How long has patient lived in current situation?: 2 years What is atmosphere in current home: Comfortable, Supportive  Family History:  Are you sexually active?: No What is your sexual orientation?: straight Does patient have children?: No  Childhood History:  By whom was/is the patient raised?: Mother Additional childhood history information: father never in the picture Description of patient's relationship with caregiver when they were a child: good Patient's description of current relationship with people who raised him/her: good Does patient have siblings?: Yes Number of Siblings: 4 Description of patient's current relationship with siblings: good Did patient suffer any verbal/emotional/physical/sexual abuse as a child?: No Did patient suffer from severe childhood neglect?: No Has patient ever been sexually abused/assaulted/raped as an adolescent or adult?: No Was the patient ever a victim of a crime or a disaster?: No Witnessed domestic  violence?: No Has patient been effected by domestic violence as an adult?: No  Education:  Highest grade of school patient has completed: 12,  plus 2 year degree from [employer]-then went to [employer], but dropped out Currently a student?: No Learning disability?: No  Employment/Work Situation:   Employment situation: Employed Where is patient currently employed?: [employer] How long has patient been employed?: 6 months Patient's job has been impacted by current illness: Yes Describe how patient's job has been impacted: Unable to work due to psychosis What is the longest time patient has a held a job?: see above Did You Receive Any Psychiatric Treatment/Services While in the [employer] No Are There Guns or Other Weapons in Your Home?: No  Financial Resources:   Financial resources: Income from employment Does patient have a representative payee or guardian?: No  Alcohol/Substance Abuse:   What has been your use of drugs/alcohol within the last 12 months?: Wine occasionally, UDS positive for cannabis Alcohol/Substance Abuse Treatment Hx: Denies past history Has alcohol/substance abuse ever caused legal problems?: No  Social Support System:   Conservation officer, natureatient's Community Support System: Good Describe Community Support System: mother, grandmother, siblings, 2 close frinds Type of faith/religion: Raised in church How does patient's faith help to cope with current illness?: Unsure  Leisure/Recreation:   Leisure and Hobbies: "Homebody"  Strengths/Needs:   What is the patient's perception of their strengths?: Unable to identify Patient states they can use these personal strengths during their treatment to contribute to their recovery: Unable to answer Patient states these barriers may affect/interfere with their treatment: Unable to answer Patient states these barriers may affect their return to the community: Unable to answer Other important information patient would like considered in  planning for their treatment: None  Discharge Plan:   Currently receiving community mental health services: No Patient  states concerns and preferences for aftercare planning are: Currently refusing medication Patient states they will know when they are safe and ready for discharge when: States she is ready to go now Does patient have access to transportation?: Yes Does patient have financial barriers related to discharge medications?: No Will patient be returning to same living situation after discharge?: Yes  Summary/Recommendations:   Summary and Recommendations (to be completed by the evaluator): Erin Christensen is a 22 YO AA female diagnosed with Psychosis NOS.  She presents, IVC'd by the ED with delusions, stating "I am God here to bring a message to my people."  According to her mother, who was interviewed in order to glean information, she has no hisotry of mental health issues, and there is no hisotry on the maternal side.  However, mother is unsure about paternal side. At d/c, Erin Christensen will return home and follow up outpt.  While here, she can benfot from crises stabilization, medication management, therapeutic milieu and referral for services.  Ida Rogue. 01/20/2018

## 2018-01-20 NOTE — [facility] Group Notes (Signed)
LCSW Group Therapy Note   01/20/2018 1:15pm   Type of Therapy and Topic:  Group Therapy:  Overcoming Obstacles   Participation Level:  Minimal   Description of Group:    In this group patients will be encouraged to explore what they see as obstacles to their own wellness and recovery. They will be guided to discuss their thoughts, feelings, and behaviors related to these obstacles. The group will process together ways to cope with barriers, with attention given to specific choices patients can make. Each patient will be challenged to identify changes they are motivated to make in order to overcome their obstacles. This group will be process-oriented, with patients participating in exploration of their own experiences as well as giving and receiving support and challenge from other group members.   Therapeutic Goals: 1. Patient will identify personal and current obstacles as they relate to admission. 2. Patient will identify barriers that currently interfere with their wellness or overcoming obstacles.  3. Patient will identify feelings, thought process and behaviors related to these barriers. 4. Patient will identify two changes they are willing to make to overcome these obstacles:      Summary of Patient Progress   Stayed the entire time.  Observed responding to internal stimuli.  When given the opportunity to participate, Talked about the difficulty of being the "strongest angel-its emotional." Unable/unwilling to clarify further. Other than a statement about "evil that is around me."   Therapeutic Modalities:   Cognitive Behavioral Therapy Solution Focused Therapy Motivational Interviewing Relapse Prevention Therapy  Ida RogueRodney B Mikele Sifuentes, LCSW 01/20/2018 2:04 PM

## 2018-01-20 NOTE — Progress Notes (Signed)
Pt in room sitting on bed appearing to be responding to internal stimuli. " YOU ARE NOT MY GOD AND YOU NEED TO LEAVE THIS ROOM NOW" pt repeating this statement when no one else was seen in the room

## 2018-01-20 NOTE — Progress Notes (Signed)
Recreation Therapy Notes  Date: 7.9.19 Time: 1000 Location: 500 Hall Dayroom  Group Topic: Leisure Education  Goal Area(s) Addresses:  Patient will identify positive leisure activities.  Patient will identify one positive benefit of participation in leisure activities.   Behavioral Response: Engaged  Intervention: 2 Small [employer] Cla[employer]orneBeach Balls  Activity: Keep [employer] ContractorGoing Volleyball.  Patients were seated in a circle.  LRT placed one ball in rotation.  Patients were instructed they had to keep the ball moving at all times.  LRT would keep count of the number of hits on the ball. If the ball stopped moving at any time, the count would start over.  Education:  Leisure Education, Building control surveyorDischarge Planning  Education Outcome: Acknowledges education/In group clarification offered/Needs additional education  Clinical Observations/Feedback: Pt arrived late to group.  Pt had to be redirected a number of times not to hit the balls so hard.  Pt stated she used to play volleyball.  Pt was flat and seemed to be responding in internal stimuli at times.   Caroll RancherMarjette Keondrick Dilks, LRT/CTRS    Caroll RancherLindsay, Kerrick Miler A 01/20/2018 12:24 PM

## 2018-01-20 NOTE — Progress Notes (Signed)
Recreation Therapy Notes  INPATIENT RECREATION THERAPY ASSESSMENT  Patient Details Name: Erin Christensen MRN: [id number] DOB: [birth year] Today's Date: 01/20/2018       Information Obtained From: Patient  Able to Participate in Assessment/Interview: Yes  Patient Presentation: Alert(Pt seemed to be responding to internal stimuli)  Reason for Admission (Per Patient): Other (Comments)(Hallucinations)  Patient Stressors: Other (Comment)(Pt stated none.)  Coping Skills:   Journal, TV, Arguments, Music, Exercise, Meditate, Deep Breathing, Substance Abuse, Talk, Art, Prayer, Avoidance, Read, Hot Bath/Shower  Leisure Interests (2+):  Sports - Baseball, Sports - Other (Comment)(Volleyball)  Frequency of Recreation/Participation: Monthly  Awareness of Community Resources:  No  Community Resources:     Current Use:    If no, Barriers?:    Expressed Interest in State Street CorporationCommunity Resource Information: Yes  County of Residence:  [place]  Patient Main Form of Transportation: Therapist, musicublic Transportation  Patient Strengths:  "I the strongest angel"  Patient Identified Areas of Improvement:  "Leading"  Patient Goal for Hospitalization:  "To get out of here"  Current SI (including self-harm):  No  Current HI:  Yes(Pt initially stated no but then said yeah and started laughing.)  Current AVH: Yes  Staff Intervention Plan: Group Attendance, Collaborate with Interdisciplinary Treatment Team  Consent to Intern Participation: N/A   Caroll RancherMarjette Dorma Altman, LRT/CTRS   Lillia AbedLindsay, Levern Pitter A 01/20/2018, 1:37 PM

## 2018-01-21 MED ORDER — HALOPERIDOL 5 MG PO TABS
5.0000 mg | ORAL_TABLET | Freq: Four times a day (QID) | ORAL | Status: DC | PRN
Start: 2018-01-21 — End: 2018-01-24

## 2018-01-21 MED ORDER — HALOPERIDOL LACTATE 5 MG/ML IJ SOLN: 2.0000 mg | Freq: Four times a day (QID) | INTRAMUSCULAR | Status: DC | PRN

## 2018-01-21 MED ORDER — OLANZAPINE 5 MG PO TBDP
15.0000 mg | ORAL_TABLET | Freq: Every day | ORAL | Status: DC
  Administered 2018-01-21: 15 mg via ORAL
  Filled 2018-01-21 (×4): qty 3

## 2018-01-21 NOTE — Plan of Care (Signed)
  Problem: Education: Goal: Mental status will improve Outcome: Not Progressing   Problem: Education: Goal: Will be free of psychotic symptoms Outcome: Not Progressing   Problem: Coping: Goal: Coping ability will improve Outcome: Not Progressing   Minerva EndsKeydysha remains actively psychotic and unwilling to take PO medications.  Staff will continue to monitor mental status and encourage PO medications as ordered.  We will continue to monitor the progress towards her goals.

## 2018-01-21 NOTE — [facility] Group Notes (Signed)
LCSW Group Therapy Note   01/21/2018 1:15pm   Type of Therapy and Topic:  Group Therapy:  Overcoming Obstacles   Participation Level:  Minimal   Description of Group:    In this group patients will be encouraged to explore what they see as obstacles to their own wellness and recovery. They will be guided to discuss their thoughts, feelings, and behaviors related to these obstacles. The group will process together ways to cope with barriers, with attention given to specific choices patients can make. Each patient will be challenged to identify changes they are motivated to make in order to overcome their obstacles. This group will be process-oriented, with patients participating in exploration of their own experiences as well as giving and receiving support and challenge from other group members.   Therapeutic Goals: 1. Patient will identify personal and current obstacles as they relate to admission. 2. Patient will identify barriers that currently interfere with their wellness or overcoming obstacles.  3. Patient will identify feelings, thought process and behaviors related to these barriers. 4. Patient will identify two changes they are willing to make to overcome these obstacles:      Summary of Patient Progress  Stayed the entire time, engaged throughout.  Identified several ways to deal with stress, including writing poetry.      Therapeutic Modalities:   Cognitive Behavioral Therapy Solution Focused Therapy Motivational Interviewing Relapse Prevention Therapy  Ida RogueRodney B Oden Lindaman, LCSW 01/21/2018 11:48 AM

## 2018-01-21 NOTE — Progress Notes (Signed)
Recreation Therapy Notes  Date: 7.10.19 Time: 1000 Location: 500 Hall   Group Topic: Communication  Goal Area(s) Addresses:  Patient will effectively communicate with peers in group.  Patient will verbalize benefit of healthy communication. Patient will verbalize positive effect of healthy communication on post d/c goals.  Patient will identify communication techniques that made activity effective for group.   Behavioral Response: Engaged  Intervention: Veterinary surgeonubber Discs  Activity: Sharks in [employer] [employer]. Each patient was given a rubber disc and one extra disc for the group as whole.  Patients were to [facility] LLCmanuveur from one end of the hall to the other and back to the starting point.  If any person stepped off the disc, the group would have to start over.  Education: Communication, Discharge Planning  Education Outcome: Acknowledges understanding/In group clarification offered/Needs additional education.   Clinical Observations/Feedback: Pt came up with the strategy for group.  Pt seemed drowsy at points but was able to complete the activity.  Pt was pleasant and able to focus during activity.    Caroll RancherMarjette Meigan Pates, LRT/CTRS    Caroll RancherLindsay, Roger Fasnacht A 01/21/2018 12:38 PM

## 2018-01-21 NOTE — Progress Notes (Signed)
D:  Erin Christensen has been in her room all evening.  She came out a few times to pace the floor and talking loudly saying "God."  She was noted talking to herself in her room all evening repeating, "You are not my God and you need to leave right now."  As the night progressed, she started yelling and getting agitated.  PO medication offered but she refused stating, "I am fine" and "It's going to be ok."  Orders obtained for IM medication.  She allowed RN to give her medications without difficulty.  She was questioned about talking to the voices and told staff that she wasn't hearing voices but was talking to the demons in the room.  The medication was effective and she was finally able to lay down and rest.  She continues to be unable to tolerate a roommate. A:  1:1 with RN for support and encouragement.  Medications offered as ordered.  Q 15 minute checks maintained for safety.  Encouraged participation in group and unit activities.   R:  Erin Christensen remains safe on the unit.  We will continue to monitor the progress towards her goals.

## 2018-01-21 NOTE — Progress Notes (Signed)
[facility] MD Progress Note  01/21/2018 4:56 PM Erin Christensen  MRN:  [id number] Subjective:  "I am God.  These injections are making me tired. I don't want them anymore. "  Principal Problem: Psychosis, paranoid (HCC) Diagnosis:   Patient Active Problem List   Diagnosis Date Noted  . Marijuana use [F12.90] 01/20/2018  . 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 Psychosis (HCC) [F29]   . Trichomonal vaginitis [A59.01]    Total Time spent with patient: 35 min.   History of Present Illness: Erin Christensen a 22 y.o.femalepatient admitted with acute psychosis. Pt was seen and chart reviewed with treatment team and Dr Sharma Covert.Pt was brought to the [facility] under IVC after she was found at home staring into space and mumbling to herself. Pt had also been entering into other peoples apartments and telling them she was God. During assessment today she was sitting in the chair in her room, guarded, and responding to internal stimuli. Per chart review Pt has no mental health history in this area. Pt was able to state why she was in the hospital but is guardedwiththought blocking. Pt refused to answer any further questions. Labs and testing were reviewed. Pt was found to have trichomonas on admission and was treated with IM Rocephin. Pt's UDS positive for THC, BAL negative. Pt was started on Zyprexa for mood stabilization while in the [facility] and has been calm and cooperative and spending most of her time in her room. Pt would benefit form an inpatient psychiatric admission for crisis stabilization and medication management.  On evaluation today, patient comes to provider office for interview.  She states, "I am God".  She denies AH or VH of demons last night as reported by nursing.  She denies SI, HI, AVH today. When patient is present, spoke with mother who states she seems better but tired.  Patient requests to not get anymore injections.  She states that she is tolerating PO Zyprexa without difficulty.  Reports a good appetite and states she is resting well. Erin Christensen denies any symptoms of depression, SIHI, AVH, delusional thoughts or paranoia, and does not appear to be responding to any internal stimuli. Patient is visible on the milieu.  Patient seen attending groups  session with active and engaged participation. Erin Christensen has agreed to continue her current plan of care already in progress. She denies any other issues or concerns. Support encouragement reassurance was provided.  Past Psychiatric History: see H&P  Past Medical History:  Past Medical History:  Diagnosis Date  . Generalized headaches    History reviewed. No pertinent surgical history. Family History:  Family History  Problem Relation Age of Onset  . Colon cancer Neg Hx   . Stomach cancer Neg Hx   . Esophageal cancer Neg Hx    Family Psychiatric  History: See H&P Social History:  Social History   Substance and Sexual Activity  Alcohol Use Yes   Comment: social     Social History   Substance and Sexual Activity  Drug Use No    Social History   Socioeconomic History  . Marital status: Single    Spouse name: Not on file  . Number of children: Not on file  . Years of education: Not on file  . Highest education level: Not on file  Occupational History  . Not on file  Social Needs  . Financial resource strain: Not on file  . Food insecurity:    Worry: Not on file    Inability:  Not on file  . Transportation needs:    Medical: Not on file    Non-medical: Not on file  Tobacco Use  . Smoking status: Current Every Day Smoker    Years: 0.50  . Smokeless tobacco: Never Used  Substance and Sexual Activity  . Alcohol use: Yes    Comment: social  . Drug use: No  . Sexual activity: Yes  Lifestyle  . Physical activity:    Days per week: Not on file    Minutes per session: Not on file  . Stress: Not on file  Relationships  . Social connections:    Talks on phone: Not on file    Gets together:  Not on file    Attends religious service: Not on file    Active member of club or organization: Not on file    Attends meetings of clubs or organizations: Not on file    Relationship status: Not on file  Other Topics Concern  . Not on file  Social History Narrative  . Not on file   Additional Social History:       See H&P                  Sleep: Good  Appetite:  Good  Current Medications: Current Facility-Administered Medications  Medication Dose Route Frequency Provider Last Rate Last Dose  . acetaminophen (TYLENOL) tablet 650 mg  650 mg Oral Q6H PRN Laveda AbbeParks, Laurie Britton, NP      . acetaminophen (TYLENOL) tablet 650 mg  650 mg Oral Q4H PRN Laveda AbbeParks, Laurie Britton, NP      . alum & mag hydroxide-simeth (MAALOX/MYLANTA) 200-200-20 MG/5ML suspension 30 mL  30 mL Oral Q4H PRN Laveda AbbeParks, Laurie Britton, NP      . haloperidol (HALDOL) tablet 5 mg  5 mg Oral Q6H PRN Mariel CraftMaurer, Zyanna Leisinger M, MD       Or  . haloperidol lactate (HALDOL) injection 2 mg  2 mg Intramuscular Q6H PRN Mariel CraftMaurer, Serrina Minogue M, MD      . hydrOXYzine (ATARAX/VISTARIL) tablet 25 mg  25 mg Oral TID PRN Laveda AbbeParks, Laurie Britton, NP      . LORazepam (ATIVAN) tablet 1 mg  1 mg Oral Q6H PRN Thermon Leylandavis, Laura A, NP   1 mg at 01/19/18 1856   Or  . LORazepam (ATIVAN) injection 1 mg  1 mg Intramuscular Q6H PRN Fransisca Kaufmannavis, Laura A, NP   1 mg at 01/19/18 1939  . magnesium hydroxide (MILK OF MAGNESIA) suspension 30 mL  30 mL Oral Daily PRN Laveda AbbeParks, Laurie Britton, NP      . OLANZapine zydis (ZYPREXA) disintegrating tablet 15 mg  15 mg Oral QHS Mariel CraftMaurer, Rashid Whitenight M, MD      . OLANZapine zydis [facility](ZYPREXA) disintegrating tablet 5 mg  5 mg Oral Daily Laveda AbbeParks, Laurie Britton, NP   5 mg at 01/21/18 0816  . traZODone (DESYREL) tablet 50 mg  50 mg Oral QHS PRN Laveda AbbeParks, Laurie Britton, NP        Lab Results: No results found for this or any previous visit (from the past 48 hour(s)).  Blood Alcohol level:  Lab Results  Component Value Date   ETH <10 01/18/2018     Metabolic Disorder Labs: No results found for: HGBA1C, MPG No results found for: PROLACTIN No results found for: CHOL, TRIG, HDL, CHOLHDL, VLDL, LDLCALC  Physical Findings: AIMS: Facial and Oral Movements Muscles of Facial Expression: None, normal Lips and Perioral Area: None, normal Jaw: None, normal Tongue: None, normal,Extremity  Movements Upper (arms, wrists, hands, fingers): None, normal Lower (legs, knees, ankles, toes): None, normal, Trunk Movements Neck, shoulders, hips: None, normal, Overall Severity Severity of abnormal movements (highest score from questions above): None, normal Incapacitation due to abnormal movements: None, normal Patient's awareness of abnormal movements (rate only patient's report): No Awareness, Dental Status Current problems with teeth and/or dentures?: No Does patient usually wear dentures?: No  CIWA:    COWS:     Musculoskeletal: Strength & Muscle Tone: within normal limits Gait & Station: normal Patient leans: N/A  Psychiatric Specialty Exam: Physical Exam  Nursing note and vitals reviewed. Psychiatric:  Paranoid and delusional      Review of Systems  Constitutional: Negative.   Respiratory: Negative.   Cardiovascular: Negative.   Neurological: Negative.   Psychiatric/Behavioral: Positive for hallucinations and substance abuse. Negative for depression and suicidal ideas. The patient is not nervous/anxious and does not have insomnia.     Blood pressure 118/82, pulse (!) 111, temperature 97.8 F (36.6 C), temperature source Oral, resp. rate 20, height 5\' 7"  (1.702 m), weight 81.6 kg (180 lb).Body mass index is 28.19 kg/m.  General Appearance: Fairly Groomed and has towel on head  Eye Contact:  Fair  Speech:  Clear and Coherent  Volume:  soft  Mood:  Dysphoric  Affect:  Blunt  Thought Process:  Disorganized and Descriptions of Associations: Circumstantial  Orientation:  Full (Time, Place, and Person)  Thought Content:   Illogical, Delusions and Hallucinations: appears to be responding to internal stimuli  Suicidal Thoughts:  No  Homicidal Thoughts:  No  Memory:  Immediate;   Fair Recent;   Fair Remote;   Fair  Judgement:  Poor  Insight:  Lacking  Psychomotor Activity:  Normal  Concentration:  Concentration: Fair and Attention Span: Fair  Recall:  [employer] of Knowledge:  Fair  Language:  Good  Akathisia:  No  Handed:  Right  AIMS (if indicated):     Assets:  Social Support  ADL's:  Intact  Cognition:  WNL  Sleep:  Number of Hours: 6.25     Treatment Plan Summary: Daily contact with patient to assess and evaluate symptoms and progress in treatment and Medication management    -Continue inpatient hospitalization.   -Will continue today, 01/21/18 plan as below except where it is noted.   -Psychosis            -Change to Zyprexa 15 mg PO QHS and discontinue PO Zyprexa 5 mg in the AM.   -Anxiety                        -continue Atarax 50mg  po q6h prn anxiety   -Insomnia            - Continue Trazodone 50 mg PRN at HS         -Agitation                      -Continue haldol 5 mg PO or 2 mg IM q 6 hrs prn agitation/psychosis             -Continue ativan 2mg  po/IM q6h prn agitation   -Encourage participation in groups and therapeutic milieu  - Will continue to monitor vitals ,medication compliance and treatment side effects while patient is here.    -Reviewed labs  -Disposition planning will be ongoing    Mariel Craft, MD 01/21/2018, 4:56 PM

## 2018-01-21 NOTE — Progress Notes (Signed)
Adult Psychoeducational Group Note  Date:  01/21/2018 Time:  3:39 PM  Group Topic/Focus:  Goals Group:   The focus of this group is to help patients establish daily goals to achieve during treatment and discuss how the patient can incorporate goal setting into their daily lives to aide in recovery.  Participation Level:  Minimal  Participation Quality:  Drowsy  Affect:  Flat  Cognitive:  Lacking  Insight: Limited  Engagement in Group:  Limited  Modes of Intervention:  Discussion  Additional Comments:  Pt attended group, but did not participate.   Deforest Hoyleslexandre J Charly Holcomb 01/21/2018, 3:39 PM

## 2018-01-21 NOTE — Progress Notes (Signed)
ZOXWRUEAKeydysha slept much of the night.  She woke up and came to the day room for vital signs.  She was noted pacing the floor but not yelling out.  She declined going to breakfast this am.  We will continue to monitor the progress towards her goals.

## 2018-01-21 NOTE — Plan of Care (Signed)
Patient did not participate in goals group this morning and did not set a daily goal.   Patient denies SI and HI this shift.  Patient was noted to be cowering in her room beside her bed stating "I am seeing demons."  Patient took medications this shift, but needed the support of staff due to cheeking behaviors. Patient remains isolative and paranoid, but has been coming out of her room for longer periods of time and going to meals.   Assess patient for safety, offer medications as prescribed, engage patient in 1:1 staff talks.   Patient able to contract for safety, continue to monitor as planned.

## 2018-01-22 MED ORDER — OLANZAPINE 10 MG PO TABS
20.0000 mg | ORAL_TABLET | Freq: Every day | ORAL | Status: DC
  Administered 2018-01-22: 20 mg via ORAL
  Filled 2018-01-22 (×4): qty 2

## 2018-01-22 NOTE — Progress Notes (Signed)
Erin Christensen woke up around 4am.  She was noted talking loudly telling the demons to "get the fuck out."  She later calmed down and is now sitting calmly in the day room watching TV.  No prn's required.  We will continue to monitor the progress towards her goals.

## 2018-01-22 NOTE — Progress Notes (Signed)
D:  Erin Christensen was asleep all evening.  She did not attend evening wrap up group.  She was noted to be awake after 11pm.  She was able to report that she does not see demons in her room tonight and stated "lets not talk about them" when RN asked what they were telling her last night.  She denied SI/HI or auditory hallucinations.  She remains guarded but pleasant.  She was able to take her hs medications without difficulty.  Informed her that her grandmother has been calling the unit and that she may want to call her tomorrow.  She denied any pain or discomfort and appeared to be in no physical distress.   A:  1:1 with RN for support and encouragement.  Medications as ordered.  Q 15 minute checks maintained for safety.  Encouraged participation in group and unit activities.   R:  Erin Christensen remains safe on the unit.  We will continue to monitor the progress towards her goals.

## 2018-01-22 NOTE — Plan of Care (Signed)
  Problem: Activity: Goal: Sleeping patterns will improve 01/22/2018 0044 by Levin Baconeddick, Bettina Warn V, RN Outcome: Progressing 01/22/2018 0043 by Levin Baconeddick, Terril Amaro V, RN Outcome: Progressing   Problem: Education: Goal: Will be free of psychotic symptoms Outcome: Progressing   Problem: Health Behavior/Discharge Planning: Goal: Compliance with prescribed medication regimen will improve Outcome: Progressing  Erin Christensen was able to sleep better last night.  She took hs medications tonight without difficulty and voiced that she wasn't seeing the demons in her room tonight.  We will continue to monitor the progress towards her goals.

## 2018-01-22 NOTE — Progress Notes (Signed)
Erin Christensen  MRN:  [id number] Subjective:  "I am one with God, which means. I am God.  I am feeling better taking the medication as the film."  Principal Problem: Psychosis, paranoid (HCC) Diagnosis:   Patient Active Problem List   Diagnosis Date Noted  . Marijuana use [F12.90] 01/20/2018  . 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 Psychosis (HCC) [F29]   . Trichomonal vaginitis [A59.01]    Total Time spent with patient: 35 min.   History of Present Illness: Erin Christensen a 22 y.o.femalepatient admitted with acute psychosis. Pt was seen and chart reviewed with treatment team and Dr Sharma Covert.Pt was brought to the [facility] under IVC after she was found at home staring into space and mumbling to herself. Pt had also been entering into other peoples apartments and telling them she was God. During assessment today she was sitting in the chair in her room, guarded, and responding to internal stimuli. Per chart review Pt has no mental health history in this area. Pt was able to state why she was in the hospital but is guardedwiththought blocking. Pt refused to answer any further questions. Labs and testing were reviewed. Pt was found to have trichomonas on admission and was treated with IM Rocephin. Pt's UDS positive for THC, BAL negative. Pt was started on Zyprexa for mood stabilization while in the [facility] and has been calm and cooperative and spending most of her time in her room. Pt would benefit form an inpatient psychiatric admission for crisis stabilization and medication management.  On evaluation today, patient comes to provider office for interview.   She denies AH or VH, but does admit that she did see demons the night before.  Patient is able to talk about her past from childhood to current including school and working responsibilities.  She states that she was fired from her last job because of "always walking around chanting."  She is  hopeful to be discharged soon.  She would like to have some time at the beach, and plans to move into a new apartment next month. Reports a good appetite and states she is resting well. Erin Christensen denies any symptoms of depression, SIHI, AVH, delusional thoughts or paranoia, and does not appear to be responding to any internal stimuli. Patient is visible on the milieu.  She was overheard telling a case-worker that she is pregnant (labs negative for pregnancy).  Patient seen attending groups  session with active and engaged participation. Erin Christensen has agreed to continue her current plan of care already in progress. She denies any other issues or concerns. Support encouragement reassurance was provided.  Past Psychiatric History: see H&P  Past Medical History:  Past Medical History:  Diagnosis Date  . Generalized headaches    History reviewed. No pertinent surgical history. Family History:  Family History  Problem Relation Age of Onset  . Colon cancer Neg Hx   . Stomach cancer Neg Hx   . Esophageal cancer Neg Hx    Family Psychiatric  History: See H&P Social History:  Social History   Substance and Sexual Activity  Alcohol Use Yes   Comment: social     Social History   Substance and Sexual Activity  Drug Use No    Social History   Socioeconomic History  . Marital status: Single    Spouse name: Not on file  . Number of children: Not on file  . Years of education: Not on file  .  Highest education level: Not on file  Occupational History  . Not on file  Social Needs  . Financial resource strain: Not on file  . Food insecurity:    Worry: Not on file    Inability: Not on file  . Transportation needs:    Medical: Not on file    Non-medical: Not on file  Tobacco Use  . Smoking status: Current Every Day Smoker    Years: 0.50  . Smokeless tobacco: Never Used  Substance and Sexual Activity  . Alcohol use: Yes    Comment: social  . Drug use: No  . Sexual  activity: Yes  Lifestyle  . Physical activity:    Days per week: Not on file    Minutes per session: Not on file  . Stress: Not on file  Relationships  . Social connections:    Talks on phone: Not on file    Gets together: Not on file    Attends religious service: Not on file    Active member of club or organization: Not on file    Attends meetings of clubs or organizations: Not on file    Relationship status: Not on file  Other Topics Concern  . Not on file  Social History Narrative  . Not on file   Additional Social History:       See H&P                  Sleep: Good  Appetite:  Good  Current Medications: Current Facility-Administered Medications  Medication Dose Route Frequency Provider Last Rate Last Dose  . acetaminophen (TYLENOL) tablet 650 mg  650 mg Oral Q6H PRN Laveda Abbe, NP      . acetaminophen (TYLENOL) tablet 650 mg  650 mg Oral Q4H PRN Laveda Abbe, NP      . alum & mag hydroxide-simeth (MAALOX/MYLANTA) 200-200-20 MG/5ML suspension 30 mL  30 mL Oral Q4H PRN Laveda Abbe, NP      . haloperidol (HALDOL) tablet 5 mg  5 mg Oral Q6H PRN Mariel Craft, MD       Or  . haloperidol lactate (HALDOL) injection 2 mg  2 mg Intramuscular Q6H PRN Mariel Craft, MD      . hydrOXYzine (ATARAX/VISTARIL) tablet 25 mg  25 mg Oral TID PRN Laveda Abbe, NP      . LORazepam (ATIVAN) tablet 1 mg  1 mg Oral Q6H PRN Thermon Leyland, NP   1 mg at 01/19/18 1856   Or  . LORazepam (ATIVAN) injection 1 mg  1 mg Intramuscular Q6H PRN Fransisca Kaufmann A, NP   1 mg at 01/19/18 1939  . magnesium hydroxide (MILK OF MAGNESIA) suspension 30 mL  30 mL Oral Daily PRN Laveda Abbe, NP      . OLANZapine zydis (ZYPREXA) disintegrating tablet 15 mg  15 mg Oral QHS Mariel Craft, MD   15 mg at 01/21/18 2314  . traZODone (DESYREL) tablet 50 mg  50 mg Oral QHS PRN Laveda Abbe, NP        Lab Results: No results found for this or any  previous visit (from the past 48 hour(s)).  Blood Alcohol level:  Lab Results  Component Value Date   ETH <10 01/18/2018    Metabolic Disorder Labs: No results found for: HGBA1C, MPG No results found for: PROLACTIN No results found for: CHOL, TRIG, HDL, CHOLHDL, VLDL, LDLCALC  Physical Findings: AIMS: Facial and Oral Movements  Muscles of Facial Expression: None, normal Lips and Perioral Area: None, normal Jaw: None, normal Tongue: None, normal,Extremity Movements Upper (arms, wrists, hands, fingers): None, normal Lower (legs, knees, ankles, toes): None, normal, Trunk Movements Neck, shoulders, hips: None, normal, Overall Severity Severity of abnormal movements (highest score from questions above): None, normal Incapacitation due to abnormal movements: None, normal Patient's awareness of abnormal movements (rate only patient's report): No Awareness, Dental Status Current problems with teeth and/or dentures?: No Does patient usually wear dentures?: No  CIWA:    COWS:     Musculoskeletal: Strength & Muscle Tone: within normal limits Gait & Station: normal Patient leans: N/A  Psychiatric Specialty Exam: Physical Exam  Nursing note and vitals reviewed. Psychiatric:  Paranoid and delusional      Review of Systems  Constitutional: Negative.   Respiratory: Negative.   Cardiovascular: Negative.   Neurological: Negative.   Psychiatric/Behavioral: Positive for hallucinations and substance abuse. Negative for depression and suicidal ideas. The patient is not nervous/anxious and does not have insomnia.     Blood pressure (!) 87/68, pulse 96, temperature 98.8 F (37.1 C), temperature source Oral, resp. rate 18, height 5\' 7"  (1.702 m), weight 81.6 kg (180 lb).Body mass index is 28.19 kg/m.  General Appearance: Fairly Groomed and has towel on head  Eye Contact:  Fair  Speech:  Clear and Coherent  Volume:  soft  Mood:  Dysphoric  Affect:  Blunt  Thought Process:  Disorganized  and Descriptions of Associations: Circumstantial  Orientation:  Full (Time, Place, and Person)  Thought Content:  Illogical, Delusions and Hallucinations: appears to be responding to internal stimuli  Suicidal Thoughts:  No  Homicidal Thoughts:  No  Memory:  Immediate;   Fair Recent;   Fair Remote;   Fair  Judgement:  Poor  Insight:  Lacking  Psychomotor Activity:  Normal  Concentration:  Concentration: Fair and Attention Span: Fair  Recall:  FiservFair  Fund of Knowledge:  Fair  Language:  Good  Akathisia:  No  Handed:  Right  AIMS (if indicated):     Assets:  Social Support  ADL's:  Intact  Cognition:  WNL  Sleep:  Number of Hours: 4.75     Treatment Plan Summary: Daily contact with patient to assess and evaluate symptoms and progress in treatment and Medication management    -Continue inpatient hospitalization.   -Will continue today, 01/22/18 plan as below except where it is noted.   -Psychosis  -Increase Zyprexa to 20 mg PO QD with mouth check after administration.           -Anxiety                        -continue Atarax 50mg  po q6h prn anxiety   -Insomnia            - Continue Trazodone 50 mg PRN at HS         -Agitation                      -Continue haldol 5 mg PO or 2 mg IM q 6 hrs prn agitation/psychosis             -Continue ativan 2mg  po/IM q6h prn agitation   -Encourage participation in groups and therapeutic milieu  - Will continue to monitor vitals ,medication compliance and treatment side effects while patient is here.    -Reviewed labs  -Disposition planning will be ongoing  Mariel Craft, MD 01/22/2018, 6:32 PM

## 2018-01-22 NOTE — [facility] Group Notes (Signed)
[facility] Group Notes:  (Nursing/MHT/Case Management/Adjunct)  Date:  01/22/2018  Time:  9:02 AM  Type of Therapy:  orientation and goals group  Participation Level:  Minimal  Participation Quality:  Inattentive  Affect:  Blunted  Cognitive:  Lacking  Insight:  Lacking  Engagement in Group:  Resistant  Modes of Intervention:  Discussion and Orientation  Summary of Progress/Problems: Her goal for today is to stay positive and know that God is with her at all times.   Erin Christensen J Delora Gravatt 01/22/2018, 9:02 AM

## 2018-01-22 NOTE — [facility] Group Notes (Signed)
LCSW Group Therapy Note  01/22/2018 1:15pm  Type of Therapy/Topic:  Group Therapy:  Balance in Life  Participation Level:  Active  Description of Group:    This group will address the concept of balance and how it feels and looks when one is unbalanced. Patients will be encouraged to process areas in their lives that are out of balance and identify reasons for remaining unbalanced. Facilitators will guide patients in utilizing problem-solving interventions to address and correct the stressor making their life unbalanced. Understanding and applying boundaries will be explored and addressed for obtaining and maintaining a balanced life. Patients will be encouraged to explore ways to assertively make their unbalanced needs known to significant others in their lives, using other group members and facilitator for support and feedback.  Therapeutic Goals: 1. Patient will identify two or more emotions or situations they have that consume much of in their lives. 2. Patient will identify signs/triggers that life has become out of balance:  3. Patient will identify two ways to set boundaries in order to achieve balance in their lives:  4. Patient will demonstrate ability to communicate their needs through discussion and/or role plays  Summary of Patient Progress:  Stayed the entire time, engaged throughout.  "I am God."  Other than that statement, appeared grounded in reality.  She cited new medication as helping her feel more balanced-"you know, even steven."  Talked about how she meditates, and even walked us through one of her exercises.    Therapeutic Modalities:   Cognitive Behavioral Therapy Solution-Focused Therapy Assertiveness Training  Ida RogueRodney B Risa Auman, [place] 01/22/2018 3:20 PM

## 2018-01-22 NOTE — Progress Notes (Signed)
Recreation Therapy Notes  Date: 7.11.19 Time: 1000 Location: 500 Hall Dayroom  Group Topic: Communication, Team Building, Problem Solving  Goal Area(s) Addresses:  Patient will effectively work with peer towards shared goal.  Patient will identify skill used to make activity successful.  Patient will identify how skills used during activity can be used to reach post d/c goals.   Behavioral Response: Engaged  Intervention: STEM Activity   Activity: In team's, using 10 red plastic cups, patients were asked to build pyramid.  One of the cups will have a rubber band with 4 strings attached to it.  This cup will be used as the main tool to stack the rest of the cups.    Education: Pharmacist, communityocial Skills, Building control surveyorDischarge Planning.   Education Outcome: Acknowledges education/In group clarification offered/Needs additional education.   Clinical Observations/Feedback: Pt was engaged and appropriate during group.  Pt was able to focus throughout activity.  Pt stated the group used a lot of problem solving to complete the task.  Pt made a lot of suggestions to the group and did well taking instruction from peers.    Caroll RancherMarjette Courtney Bellizzi, LRT/CTRS      Caroll RancherLindsay, Nyanna Heideman A 01/22/2018 11:47 AM

## 2018-01-23 MED ORDER — HYDROXYZINE HCL 25 MG PO TABS: 25.0000 mg | ORAL_TABLET | Freq: Three times a day (TID) | ORAL | 0 refills | Status: AC | PRN

## 2018-01-23 MED ORDER — OLANZAPINE 20 MG PO TABS: 20.0000 mg | ORAL_TABLET | Freq: Every day | ORAL | 0 refills | Status: AC

## 2018-01-23 MED ORDER — TRAZODONE HCL 50 MG PO TABS: 50.0000 mg | ORAL_TABLET | Freq: Every evening | ORAL | 0 refills | Status: DC | PRN

## 2018-01-23 MED ORDER — OLANZAPINE 10 MG PO TABS
20.0000 mg | ORAL_TABLET | Freq: Every day | ORAL | Status: DC
  Filled 2018-01-23 (×2): qty 14

## 2018-01-23 NOTE — Progress Notes (Addendum)
  [facility] :  Will you be returning to the same living situation after discharge:  No. At discharge, do you have transportation home?: Yes,  mother-to [place]-to stay with family for awhile Do you have the ability to pay for your medications: Yes,  mental health  Release of information consent forms completed and in the chart;  Patient's signature needed at discharge.  Patient to Follow up at: Follow-up Information    [facility] Follow up on 01/27/2018.   Why:  Tuesday at 8:15 for your hospital follow-up appointment.  Bring your ID and your hospital d/c paperwork.  If you need to reschedule, call on Monday morning. Contact information: [address] [place] [address] [phone]           Next level of care provider [facility] Link:no  Safety Planning and Suicide Prevention discussed: Yes,  yes  Have you used any form of tobacco in the last 30 days? (Cigarettes, Smokeless Tobacco, Cigars, and/or Pipes): No  Has patient been referred to the Quitline?: N/A patient is not a smoker  Patient has been referred for addiction treatment: Patient refused referral  Ida RogueRodney B Delbert Vu, LCSW 01/23/2018, 2:55 PM

## 2018-01-23 NOTE — Progress Notes (Signed)
Patient discharged to lobby. Patient was stable and appreciative at that time. All papers, samples and prescriptions were given and valuables returned. Verbal understanding expressed. Denies SI/HI and A/VH. Patient given opportunity to express concerns and ask questions.  

## 2018-01-23 NOTE — [facility] Suicide Risk Assessment (Signed)
[facility]   Principal Problem: Psychosis, paranoid [facility](HCC) Discharge Diagnoses:  Patient Active Problem List   Diagnosis Date Noted  . Marijuana use [F12.90] 01/20/2018  . 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 Psychosis (HCC) [F29]   . Trichomonal vaginitis [A59.01]     Total Time spent with patient: 45 minutes  History of Present Illness:Erin Statonis a 22 y.o.femalepatient admitted with acute psychosis. Pt was seen and chart reviewed with treatment team and Dr Sharma CovertNorman.[facility] after she was found at home staring into space and mumbling to herself. Pt had also been entering into other peoples apartments and telling them she was God. During assessment today she was sitting in the chair in her room, guarded, and responding to internal stimuli. Per chart review Pt has no mental health history in this area. Pt was able to state why she was in the hospital but is guardedwiththought blocking. Pt refused to answer any further questions. Labs and testing were reviewed. Pt was found to have trichomonas on admission and was treated with IM Rocephin. Pt's UDS positive for THC, BAL negative. Pt [facility] and has been calm and cooperative and spending most of her time in her room. Pt would benefit form an inpatient psychiatric admission for crisis stabilization and medication management.  On evaluation today, patient comes to provider office for interview.  Patient is able to talk about her past from childhood to current including school and working responsibilities, and plans for future. Reports a good appetite and states she is resting well. Erin Christensen denies any symptoms of depression, SIHI, AVH, delusional thoughts or paranoia, and does not appear to be responding to any internal stimuli. Patient is visible on the milieu. Patient seen attending groups session with active  and engaged participation. She shows insight into reasons for admission, and intends to avoid illicit substances and agrees to continue medication for 6-12 months. Erin EndsKeydysha Statonhas agreed to continue her current plan of care already in progress. She denies any other issues or concerns. She was able to engage in safety planning including [facility] or contact emergency services if she feels unable to maintain her own safety or the safety of others. Pt had no further questions, comments, or concerns.  Support encouragement reassurance was provided.  Musculoskeletal: Strength & Muscle Tone: within normal limits Gait & Station: normal Patient leans: N/A  Psychiatric Specialty Exam: Review of Systems  Constitutional: Negative.   Respiratory: Negative.   Cardiovascular: Negative.   Gastrointestinal: Negative.   Musculoskeletal: Negative.   Neurological: Negative.   Psychiatric/Behavioral: Positive for hallucinations (delusions of being one with God, Denies AVH) and substance abuse. Negative for depression, memory loss and suicidal ideas. The patient is not nervous/anxious and does not have insomnia.     Blood pressure 119/84, pulse 73, temperature 98.6 F (37 C), temperature source Oral, resp. rate 20, height 5\' 7"  (1.702 m), weight 81.6 kg (180 lb).Body mass index is 28.19 kg/m.  General Appearance: Neat and keeps towel wrapped on head  Eye Contact::  Good  Speech:  Clear and Coherent and Normal Rate409  Volume:  Normal  Mood:  Euthymic  Affect:  Appropriate  Thought Process:  Coherent, Goal Directed and Linear  Orientation:  Full (Time, Place, and Person)  Thought Content:  Logical and Delusions  Suicidal Thoughts:  No  Homicidal Thoughts:  No  Memory:  Recent;  Good Remote;   Good  Judgement:  Fair  Insight:  Good  Psychomotor Activity:  Normal  Concentration:  Good  Recall:  Good  Fund of Knowledge:Good  Language: Good  Akathisia:  No  AIMS (if indicated):      Assets:  Communication Skills Desire for Improvement Housing Social Support  Sleep:  Number of Hours: 6  Cognition: WNL  ADL's:  Intact   Mental Status Per Nursing Assessment::   On Admission:  NA  Demographic Factors:  Unemployed  Loss Factors: Decrease in vocational status, Financial problems/change in socioeconomic status and dropped out of school  Historical Factors: Family history of mental illness or substance abuse  Risk Reduction Factors:   Sense of responsibility to family, Religious beliefs about death, Living with another person, especially a relative, Positive social support and Positive therapeutic relationship  Continued Clinical Symptoms:  Schizophrenia:   Less than 48 years old Paranoid or undifferentiated type  Cognitive Features That Contribute To Risk:  None    Suicide Risk:  Minimal: No identifiable suicidal ideation.  Patients presenting with no risk factors but with morbid ruminations; may be classified as minimal risk based on the severity of the depressive symptoms  Follow-up Information    [facility] Follow up.   Contact information: [address] [place] [address] [phone]           Plan Of Care/Follow-up recommendations:  Activity:  as tolerated Diet:  as tolerated   Treatment Plan Summary: Daily contact with patient to assess and evaluate symptoms and progress in treatment and Medication management   -Continue medications as per inpatient hospitalization.  -Psychosis             -Continue Zyprexa to 20 mg PO QD with mouth check after administration.  -Anxiety -continue Atarax 50mg  po q6h prn anxiety  -Reviewed labs  -Disposition: 01/23/18 into care of family.  She was able to engage in safety planning including plan to return to [facility] or contact emergency services if she feels unable to maintain her own safety or the safety of others. Pt had no further questions, comments, or concerns.    Mariel Craft, MD 01/23/2018, 2:28 PM

## 2018-01-23 NOTE — Discharge Summary (Addendum)
Physician Discharge Summary Note  Patient:  Erin Christensen is an 22 y.o., female MRN:  [id number] DOB:  May 04, 1996 Patient phone:  [phone] (home)  Patient address:   [place] [address],   Total Time spent with patient: Greater than 30 minutes  Date of Admission:  01/19/2018  Date of Discharge: 01-23-18  Reason for Admission: Acute psychosis.  Principal Problem: Psychosis, paranoid [facility])  Discharge Diagnoses: Patient Active Problem List   Diagnosis Date Noted  . Marijuana use [F12.90] 01/20/2018  . Psychosis, paranoid ([place]) [F22] 01/19/2018  . Psychosis ([place]) [F29]   . Trichomonal vaginitis [A59.01]    Past Psychiatric History: Psychosis.  Past Medical History:  Past Medical History:  Diagnosis Date  . Generalized headaches    History reviewed. No pertinent surgical history.  Family History:  Family History  Problem Relation Age of Onset  . Colon cancer Neg Hx   . Stomach cancer Neg Hx   . Esophageal cancer Neg Hx    Family Psychiatric  History: See H&P  Social History:  Social History   Substance and Sexual Activity  Alcohol Use Yes   Comment: social     Social History   Substance and Sexual Activity  Drug Use No    Social History   Socioeconomic History  . Marital status: Single    Spouse name: Not on file  . Number of children: Not on file  . Years of education: Not on file  . Highest education level: Not on file  Occupational History  . Not on file  Social Needs  . Financial resource strain: Not on file  . Food insecurity:    Worry: Not on file    Inability: Not on file  . Transportation needs:    Medical: Not on file    Non-medical: Not on file  Tobacco Use  . Smoking status: Current Every Day Smoker    Years: 0.50  . Smokeless tobacco: Never Used  Substance and Sexual Activity  . Alcohol use: Yes    Comment: social  . Drug use: No  . Sexual activity: Yes  Lifestyle  . Physical activity:    Days per week: Not  on file    Minutes per session: Not on file  . Stress: Not on file  Relationships  . Social connections:    Talks on phone: Not on file    Gets together: Not on file    Attends religious service: Not on file    Active member of club or organization: Not on file    Attends meetings of clubs or organizations: Not on file    Relationship status: Not on file  Other Topics Concern  . Not on file  Social History Narrative  . Not on file   Hospital Course: (Per Md's admission Notes): Erin Christensen a 22 y.o.femalepatient admitted with acute psychosis. Pt was seen and chart reviewed with treatment team and Dr Mariea Clonts.Pt was brought to the [facility] under IVC after she was found at home staring into space and mumbling to herself. Pt had also been entering into other peoples apartments and telling them she was God. During assessment today she was sitting in the chair in her room, guarded, and responding to internal stimuli. Per chart review Pt has no mental health history in this area. Pt was able to state why she was in the hospital but is guardedwiththought blocking. Pt refused to answer any further questions. Labs and testing were reviewed. Pt was found to  have trichomonas on admission and was treated with IM Rocephin. Pt's UDS positive for THC, BAL negative. Pt was started on Zyprexa for mood stabilization while in the [facility] and has been calm and cooperative and spending most of her time in her room. Pt would benefit form an inpatient psychiatric admission for crisis stabilization and medication management.On initial evaluation today, patient avoids interview.  She states, "I am God".  She reports that the police hurt her when they brought her in, and says, "that is not fine.". She denies AVH, again reporting "I am God". She does appear to be responding to internal stimuli.  She denies SI/HI.  She refuses to answer other questions. She has been present in the dayroom, but not interacting with peers.    After the above admission assessment, Erin Christensen was started on the medication regimen for her presenting symptoms. She received & was discharged on; Hydroxyzine 25 mg prn for anxiety, Trazodone 50 mg prn for insomna & Olanzapine 20 mg for mood control. She was enrolled & participated in the group counseling sessions being offered & held on this unit. She learned coping skills. She received no other medication regimen as she presented no other medical issues. She tolerated her treatment regimen without any adverse effects or reactions reported.   Erin Christensen is seen today by the attending psychiatrist for discharge. She says she has normal anxiety about going home. She is not overwhelmed by this. She is looking forward to working on her mental health issues. Not expressing any delusions today. No hallucinations. Feels in control of herself. No fantasy about suicide. No suicidal thoughts. Looking forward to getting back to her life. No thoughts of violence. No craving for substances. Does not feel depressed. No evidence of mania.  The nursing staff reports that patient has been appropriate on the unit. Patient has been interacting well with peers. No behavioral issues. Patient has not voiced any suicidal thoughts. Patient has not been observed to be internally stimulated or preoccupied. Patient has been tolerating her medications well. No reported adverse effects or reactions.   Patient was discussed at the treatment team meeting this morning. The team members feel that patient is back to her baseline level of function. Team agrees with plan to discharge patient today to continue mental health health care on an outpatient bais. She was provided with a 7 days worth, supply samples of her [facility] discharge medications. She left [facility] with all personal belongings in no apparent distress.   Physical Findings: AIMS: Facial and Oral Movements Muscles of Facial Expression: None, normal Lips and Perioral Area: None,  normal Jaw: None, normal Tongue: None, normal,Extremity Movements Upper (arms, wrists, hands, fingers): None, normal Lower (legs, knees, ankles, toes): None, normal, Trunk Movements Neck, shoulders, hips: None, normal, Overall Severity Severity of abnormal movements (highest score from questions above): None, normal Incapacitation due to abnormal movements: None, normal Patient's awareness of abnormal movements (rate only patient's report): No Awareness, Dental Status Current problems with teeth and/or dentures?: No Does patient usually wear dentures?: No  CIWA:    COWS:     Musculoskeletal: Strength & Muscle Tone: within normal limits Gait & Station: normal Patient leans: N/A  Psychiatric Specialty Exam: Physical Exam  Constitutional: She appears well-developed.  HENT:  Head: Normocephalic.  Eyes: Pupils are equal, round, and reactive to light.  Neck: Normal range of motion.  Cardiovascular: Normal rate.  Respiratory: Effort normal.  GI: Soft.  Genitourinary:  Genitourinary Comments: Deferred  Musculoskeletal: Normal range of motion.  Neurological: She is alert.  Skin: Skin is warm.    Review of Systems  Constitutional: Negative.   HENT: Negative.   Eyes: Negative.   Respiratory: Negative.   Cardiovascular: Negative.   Gastrointestinal: Negative.   Genitourinary: Negative.   Musculoskeletal: Negative.   Skin: Negative.   Neurological: Negative.   Endo/Heme/Allergies: Negative.   Psychiatric/Behavioral: Positive for hallucinations ( Psychosis (stable)) and substance abuse (Hx. THC (Stable)). Negative for depression, memory loss and suicidal ideas. The patient has insomnia (Stable). The patient is not nervous/anxious.     Blood pressure 119/84, pulse 73, temperature 98.6 F (37 C), temperature source Oral, resp. rate 20, height 5' 7"  (1.702 m), weight 81.6 kg (180 lb).Body mass index is 28.19 kg/m.  See Md's SRA   Have you used any form of tobacco in the last 30  days? (Cigarettes, Smokeless Tobacco, Cigars, and/or Pipes): No  Has this patient used any form of tobacco in the last 30 days? (Cigarettes, Smokeless Tobacco, Cigars, and/or Pipes): N/A  Blood Alcohol level:  Lab Results  Component Value Date   ETH <10 06/00/4599   Metabolic Disorder Labs:  No results found for: HGBA1C, MPG No results found for: PROLACTIN No results found for: CHOL, TRIG, HDL, CHOLHDL, VLDL, LDLCALC  See Psychiatric Specialty Exam and Suicide Risk Assessment completed by Attending Physician prior to discharge.  Discharge destination:  Home  Is patient on multiple antipsychotic therapies at discharge:  No   Has Patient had three or more failed trials of antipsychotic monotherapy by history:  No  Recommended Plan for Multiple Antipsychotic Therapies: NA  Allergies as of 01/23/2018   No Known Allergies     Medication List    TAKE these medications     Indication  hydrOXYzine 25 MG tablet Commonly known as:  ATARAX/VISTARIL Take 1 tablet (25 mg total) by mouth 3 (three) times daily as needed for anxiety.  Indication:  Feeling Anxious   OLANZapine 20 MG tablet Commonly known as:  ZYPREXA Take 1 tablet (20 mg total) by mouth at bedtime. For mood control  Indication:  Mood control   traZODone 50 MG tablet Commonly known as:  DESYREL Take 1 tablet (50 mg total) by mouth at bedtime as needed for sleep.  Indication:  Trouble Sleeping      Follow-up Information    [facility] up.   Contact information: [address] [place] [address] [phone]          Follow-up recommendations: Activity:  As tolerated Diet: As recommended by your primary care doctor. Keep all scheduled follow-up appointments as recommended.   Comments: Patient is instructed prior to discharge to: Take all medications as prescribed by his/her mental healthcare provider. Report any adverse effects and or reactions from the medicines to his/her outpatient provider  promptly. Patient has been instructed & cautioned: To not engage in alcohol and or illegal drug use while on prescription medicines. In the event of worsening symptoms, patient is instructed to call the crisis hotline, 911 and or go to the nearest ED for appropriate evaluation and treatment of symptoms. To follow-up with his/her primary care provider for your other medical issues, concerns and or health care needs.   Signed: Lindell Spar, NP, PMHNP, FNP-BC 01/23/2018, 1:53 PM   I have reviewed NP's Note, assessement, diagnosis and plan, and agree. I have also met with patient and completed suicide risk assessment.  Lavella Hammock, MD

## 2018-01-23 NOTE — Tx Team (Signed)
Interdisciplinary Treatment and Diagnostic Plan Update  01/23/2018 Time of Session: 2:58 PM  Erin Christensen MRN: [id number]  Principal Diagnosis: Psychosis, paranoid [facility])  Secondary Diagnoses: Principal Problem:   Psychosis, paranoid ([place]) Active Problems:   Marijuana use   Current Medications:  Current Facility-Administered Medications  Medication Dose Route Frequency Provider Last Rate Last Dose  . acetaminophen (TYLENOL) tablet 650 mg  650 mg Oral Q6H PRN Ethelene Hal, NP      . alum & mag hydroxide-simeth (MAALOX/MYLANTA) 200-200-20 MG/5ML suspension 30 mL  30 mL Oral Q4H PRN Ethelene Hal, NP      . haloperidol (HALDOL) tablet 5 mg  5 mg Oral Q6H PRN Lavella Hammock, MD       Or  . haloperidol lactate (HALDOL) injection 2 mg  2 mg Intramuscular Q6H PRN Lavella Hammock, MD      . hydrOXYzine (ATARAX/VISTARIL) tablet 25 mg  25 mg Oral TID PRN Ethelene Hal, NP      . LORazepam (ATIVAN) tablet 1 mg  1 mg Oral Q6H PRN Niel Hummer, NP   1 mg at 01/19/18 1856   Or  . LORazepam (ATIVAN) injection 1 mg  1 mg Intramuscular Q6H PRN Elmarie Shiley A, NP   1 mg at 01/19/18 1939  . magnesium hydroxide (MILK OF MAGNESIA) suspension 30 mL  30 mL Oral Daily PRN Ethelene Hal, NP      . OLANZapine [facility]) tablet 20 mg  20 mg Oral QHS Lavella Hammock, MD      . traZODone (DESYREL) tablet 50 mg  50 mg Oral QHS PRN Ethelene Hal, NP        PTA Medications: No medications prior to admission.    Patient Stressors: Marital or family conflict Substance abuse  Patient Strengths: Ability for insight Average or above average intelligence General fund of knowledge  Treatment Modalities: Medication Management, Group therapy, Case management,  1 to 1 session with clinician, Psychoeducation, Recreational therapy.   Physician Treatment Plan for Primary Diagnosis: Psychosis, paranoid ([place]) Long Term Goal(s): Improvement in symptoms so as ready for  discharge  Short Term Goals: Ability to identify changes in lifestyle to reduce recurrence of condition will improve Ability to maintain clinical measurements within normal limits will improve Ability to identify changes in lifestyle to reduce recurrence of condition will improve Ability to maintain clinical measurements within normal limits will improve Compliance with prescribed medications will improve  Medication Management: Evaluate patient's response, side effects, and tolerance of medication regimen.  Therapeutic Interventions: 1 to 1 sessions, Unit Group sessions and Medication administration.  Evaluation of Outcomes: Adequate for Discharge  Physician Treatment Plan for Secondary Diagnosis: Principal Problem:   Psychosis, paranoid ([place]) Active Problems:   Marijuana use   Long Term Goal(s): Improvement in symptoms so as ready for discharge  Short Term Goals: Ability to identify changes in lifestyle to reduce recurrence of condition will improve Ability to maintain clinical measurements within normal limits will improve Ability to identify changes in lifestyle to reduce recurrence of condition will improve Ability to maintain clinical measurements within normal limits will improve Compliance with prescribed medications will improve  Medication Management: Evaluate patient's response, side effects, and tolerance of medication regimen.  Therapeutic Interventions: 1 to 1 sessions, Unit Group sessions and Medication administration.  Evaluation of Outcomes: Adequate for Discharge   RN Treatment Plan for Primary Diagnosis: Psychosis, paranoid ([place]) Long Term Goal(s): Knowledge of disease and therapeutic regimen to maintain health  will improve  Short Term Goals: Ability to identify and develop effective coping behaviors will improve and Compliance with prescribed medications will improve  Medication Management: RN will administer medications as ordered by provider, will assess  and evaluate patient's response and provide education to patient for prescribed medication. RN will report any adverse and/or side effects to prescribing provider.  Therapeutic Interventions: 1 on 1 counseling sessions, Psychoeducation, Medication administration, Evaluate responses to treatment, Monitor vital signs and CBGs as ordered, Perform/monitor CIWA, COWS, AIMS and Fall Risk screenings as ordered, Perform wound care treatments as ordered.  Evaluation of Outcomes: Adequate for Discharge   LCSW Treatment Plan for Primary Diagnosis: Psychosis, paranoid ([place]) Long Term Goal(s): Safe transition to appropriate next level of care at discharge, Engage patient in therapeutic group addressing interpersonal concerns.  Short Term Goals: Engage patient in aftercare planning with referrals and resources  Therapeutic Interventions: Assess for all discharge needs, 1 to 1 time with Social worker, Explore available resources and support systems, Assess for adequacy in community support network, Educate family and significant other(s) on suicide prevention, Complete Psychosocial Assessment, Interpersonal group therapy.  Evaluation of Outcomes: Met  Return home, follow up outpt   Progress in Treatment: Attending groups: Yes Participating in groups: Yes Taking medication as prescribed: Yes Toleration medication: Yes, no side effects reported at this time Family/Significant other contact made: Yes Patient understands diagnosis: No Limited insight Discussing patient identified problems/goals with staff: Yes Medical problems stabilized or resolved: Yes Denies suicidal/homicidal ideation: Yes Issues/concerns per patient self-inventory: None Other: N/A  New problem(s) identified: None identified at this time.   New Short Term/Long Term Goal(s): "I don't need to be here.  I am God.".   Discharge Plan or Barriers:   Reason for Continuation of Hospitalization:   Medication  stabilization   Estimated Length of Stay: D/C today  Attendees: Patient:  01/23/2018  2:58 PM  Physician: Melba Coon, MD 01/23/2018  2:58 PM  Nursing: Legrand Como RN 01/23/2018  2:58 PM  RN Care Manager: Lars Pinks, RN 01/23/2018  2:58 PM  Social Worker: Ripley Fraise 01/23/2018  2:58 PM  Recreational Therapist: Winfield Cunas 01/23/2018  2:58 PM  Other: Norberto Sorenson 01/23/2018  2:58 PM  Other:  01/23/2018  2:58 PM    Scribe for Treatment Team:  Roque Lias LCSW 01/23/2018 2:58 PM

## 2018-01-23 NOTE — Tx Team (Signed)
Interdisciplinary Treatment and Diagnostic Plan Update  01/23/2018 Time of Session: 8:16 AM  Erin Christensen MRN: [id number]  Principal Diagnosis: Psychosis, paranoid ([place])  Secondary Diagnoses: Principal Problem:   Psychosis, paranoid ([place]) Active Problems:   Marijuana use   Current Medications:  Current Facility-Administered Medications  Medication Dose Route Frequency Provider Last Rate Last Dose  . acetaminophen (TYLENOL) tablet 650 mg  650 mg Oral Q6H PRN Ethelene Hal, NP      . acetaminophen (TYLENOL) tablet 650 mg  650 mg Oral Q4H PRN Ethelene Hal, NP      . alum & mag hydroxide-simeth (MAALOX/MYLANTA) 200-200-20 MG/5ML suspension 30 mL  30 mL Oral Q4H PRN Ethelene Hal, NP      . haloperidol (HALDOL) tablet 5 mg  5 mg Oral Q6H PRN Lavella Hammock, MD       Or  . haloperidol lactate (HALDOL) injection 2 mg  2 mg Intramuscular Q6H PRN Lavella Hammock, MD      . hydrOXYzine (ATARAX/VISTARIL) tablet 25 mg  25 mg Oral TID PRN Ethelene Hal, NP      . LORazepam (ATIVAN) tablet 1 mg  1 mg Oral Q6H PRN Niel Hummer, NP   1 mg at 01/19/18 1856   Or  . LORazepam (ATIVAN) injection 1 mg  1 mg Intramuscular Q6H PRN Elmarie Shiley A, NP   1 mg at 01/19/18 1939  . magnesium hydroxide (MILK OF MAGNESIA) suspension 30 mL  30 mL Oral Daily PRN Ethelene Hal, NP      . OLANZapine [facility]) tablet 20 mg  20 mg Oral QHS Lavella Hammock, MD   20 mg at 01/22/18 2203  . traZODone (DESYREL) tablet 50 mg  50 mg Oral QHS PRN Ethelene Hal, NP        PTA Medications: No medications prior to admission.    Patient Stressors: Marital or family conflict Substance abuse  Patient Strengths: Ability for insight Average or above average intelligence General fund of knowledge  Treatment Modalities: Medication Management, Group therapy, Case management,  1 to 1 session with clinician, Psychoeducation, Recreational therapy.   Physician Treatment  Plan for Primary Diagnosis: Psychosis, paranoid ([place]) Long Term Goal(s): Improvement in symptoms so as ready for discharge  Short Term Goals: Ability to identify changes in lifestyle to reduce recurrence of condition will improve Ability to maintain clinical measurements within normal limits will improve Ability to identify changes in lifestyle to reduce recurrence of condition will improve Ability to maintain clinical measurements within normal limits will improve Compliance with prescribed medications will improve  Medication Management: Evaluate patient's response, side effects, and tolerance of medication regimen.  Therapeutic Interventions: 1 to 1 sessions, Unit Group sessions and Medication administration.  Evaluation of Outcomes: Progressing  Physician Treatment Plan for Secondary Diagnosis: Principal Problem:   Psychosis, paranoid ([place]) Active Problems:   Marijuana use   Long Term Goal(s): Improvement in symptoms so as ready for discharge  Short Term Goals: Ability to identify changes in lifestyle to reduce recurrence of condition will improve Ability to maintain clinical measurements within normal limits will improve Ability to identify changes in lifestyle to reduce recurrence of condition will improve Ability to maintain clinical measurements within normal limits will improve Compliance with prescribed medications will improve  Medication Management: Evaluate patient's response, side effects, and tolerance of medication regimen.  Therapeutic Interventions: 1 to 1 sessions, Unit Group sessions and Medication administration.  Evaluation of Outcomes: Progressing   RN  Treatment Plan for Primary Diagnosis: Psychosis, paranoid ([place]) Long Term Goal(s): Knowledge of disease and therapeutic regimen to maintain health will improve  Short Term Goals: Ability to identify and develop effective coping behaviors will improve and Compliance with prescribed medications will  improve  Medication Management: RN will administer medications as ordered by provider, will assess and evaluate patient's response and provide education to patient for prescribed medication. RN will report any adverse and/or side effects to prescribing provider.  Therapeutic Interventions: 1 on 1 counseling sessions, Psychoeducation, Medication administration, Evaluate responses to treatment, Monitor vital signs and CBGs as ordered, Perform/monitor CIWA, COWS, AIMS and Fall Risk screenings as ordered, Perform wound care treatments as ordered.  Evaluation of Outcomes: Progressing   LCSW Treatment Plan for Primary Diagnosis: Psychosis, paranoid ([place]) Long Term Goal(s): Safe transition to appropriate next level of care at discharge, Engage patient in therapeutic group addressing interpersonal concerns.  Short Term Goals: Engage patient in aftercare planning with referrals and resources  Therapeutic Interventions: Assess for all discharge needs, 1 to 1 time with Social worker, Explore available resources and support systems, Assess for adequacy in community support network, Educate family and significant other(s) on suicide prevention, Complete Psychosocial Assessment, Interpersonal group therapy.  Evaluation of Outcomes: Met  Return home, follow up outpt   Progress in Treatment: Attending groups: Yes Participating in groups: Yes Taking medication as prescribed: Yes Toleration medication: Yes, no side effects reported at this time Family/Significant other contact made: Yes Patient understands diagnosis: No Limited insight Discussing patient identified problems/goals with staff: Yes Medical problems stabilized or resolved: Yes Denies suicidal/homicidal ideation: Yes Issues/concerns per patient self-inventory: None Other: N/A  New problem(s) identified: None identified at this time.   New Short Term/Long Term Goal(s): "I don't need to be here.  I am God.".   Discharge Plan or Barriers:    Reason for Continuation of Hospitalization: Anxiety Delusions   Medication stabilization   Estimated Length of Stay: 7/17  Attendees: Patient: Erin Christensen did not sign 01/23/2018  8:16 AM  Physician: Melba Coon, MD 01/23/2018  8:16 AM  Nursing: Legrand Como, RN 01/23/2018  8:16 AM  RN Care Manager: Lars Pinks, RN 01/23/2018  8:16 AM  Social Worker: Ripley Fraise 01/23/2018  8:16 AM  Recreational Therapist: Winfield Cunas 01/23/2018  8:16 AM  Other: Norberto Sorenson 01/23/2018  8:16 AM  Other:  01/23/2018  8:16 AM    Scribe for Treatment Team:  Roque Lias LCSW 01/23/2018 8:16 AM

## 2018-01-23 NOTE — Progress Notes (Addendum)
Recreation Therapy Notes  Date: 7.12.19 Time: 1000 Location: 500 Hall Dayroom  Group Topic: Leisure Education, Goal Setting  Goal Area(s) Addresses:  Patient will be able to identify at least 3 goals for leisure participation.  Patient will be able to identify benefit of investing in leisure participation.  Patient will be able to identify benefit of setting leisure goals.   Behavioral Response:  Engaged  Intervention: Worksheet  Activity: Garment/textile technologistGoal Planning.  Patients were to set goals for the next week, month, year and next five years.  Patients were to then identify obstacles, what they need to achieve their goals and what they can start doing tomorrow to work towards their goals.  Education:  Discharge Planning, PharmacologistCoping Skills, Leisure Education   Education Outcome: Acknowledges Education/In Group Clarification Provided/Needs Additional Education  Clinical Observations: Pt stated goals are "levels to success".  Pt stated in a week she wants to learn to trust the plan she has here; clear her thoughts in a month; being strong each day in a year; and build up her family in the next 5 years.  Pt stated her obstacles would be not praying or trusting the plan; to achieve her goals she needs to "close my mouth when I need to and clear my thoughts"; pt stated she could start today by listening more and trust God's plan.    Caroll RancherMarjette Mylan Schwarz, LRT/CTRS    Caroll RancherLindsay, Coralie Stanke A 01/23/2018 12:13 PM

## 2018-01-23 NOTE — Progress Notes (Signed)
Patient ID: Erin Christensen Welcome, female   DOB: Apr 13, 1996, 22 y.o.   MRN: [id number] Pt observed in dayroom interacting with peers. Pt denied any anxiety, depression, AVH or pain, "I have been feeling better since this morning. Pt was med complaint. All patient's questions and concerns addressed. Support, encouragement, and safe environment provided. 15-minute safety checks continue. Pt attended wrap-up group.

## 2018-03-19 IMAGING — CT CT ABD-PELV W/ CM
2 of 4 series · 15 of 46 positions shown, 17 images · IV contrast (ISOVUE)
Comparison: None.

CLINICAL DATA: Right-sided abdominal pain.  Possible fever.

EXAM:
CT ABDOMEN AND PELVIS WITH CONTRAST
TECHNIQUE: Multidetector CT imaging of the abdomen and pelvis was performed
using the standard protocol following bolus administration of
intravenous contrast.
CONTRAST:  100mL 1UBCM8-KKK IOPAMIDOL (1UBCM8-KKK) INJECTION 61%

[Series 2: abd/pel with · axial · 0.67mm/px · z∈[+1190,+1605]mm · 12 of 93 slices shown, 14 images]
[im 5/93  soft-tissue]
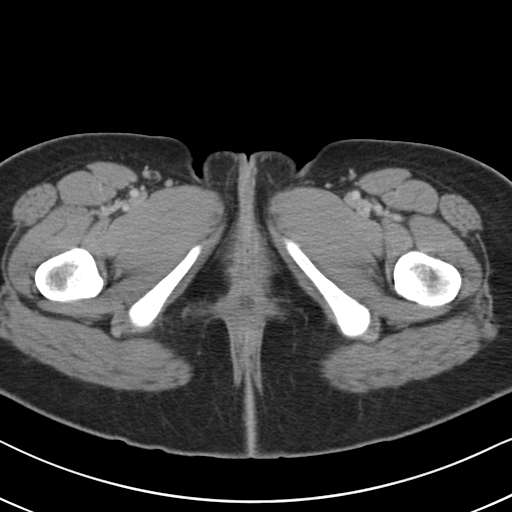
[im 5/93  bone]
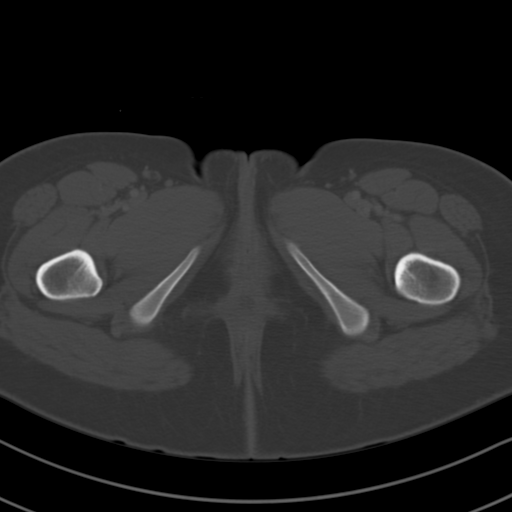
[im 14/93  soft-tissue]
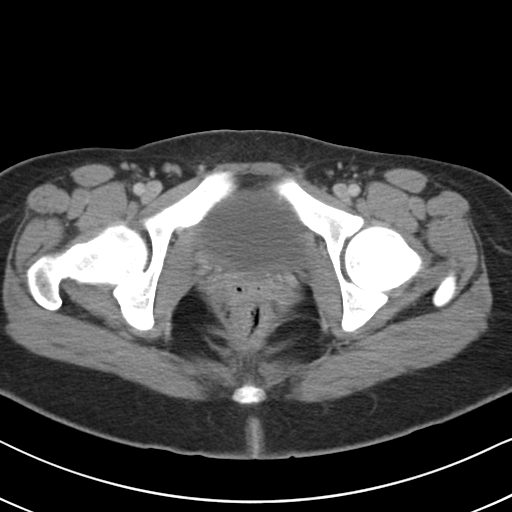
[im 19/93  soft-tissue]
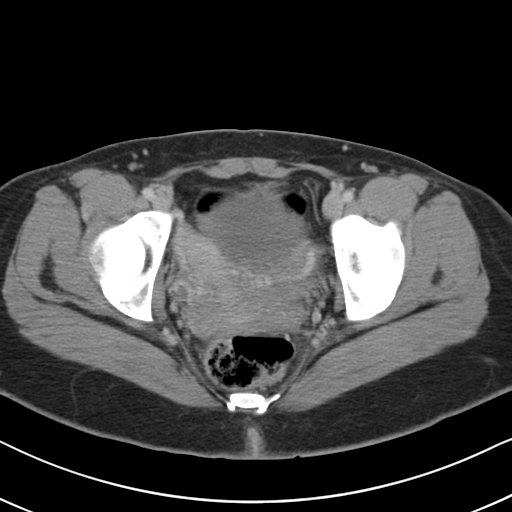
[im 28/93  soft-tissue]
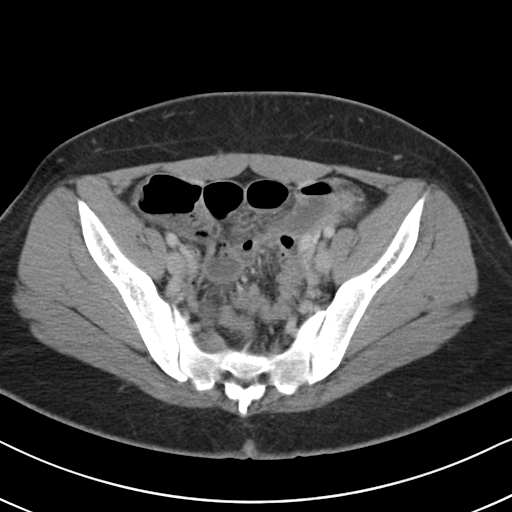
[im 37/93  soft-tissue]
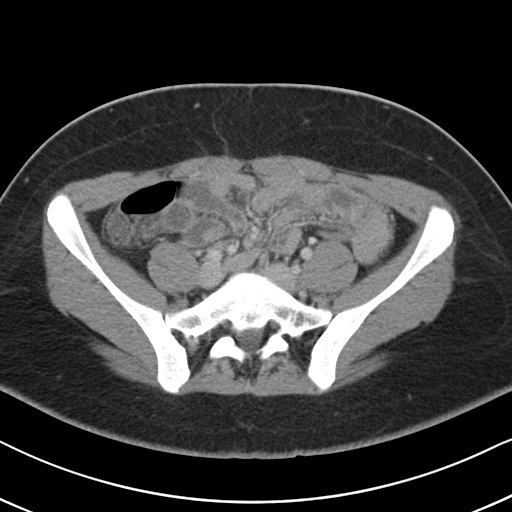
[im 42/93  soft-tissue]
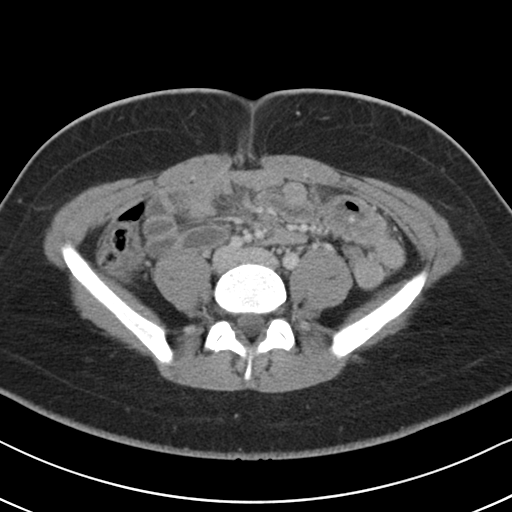
[im 51/93  soft-tissue]
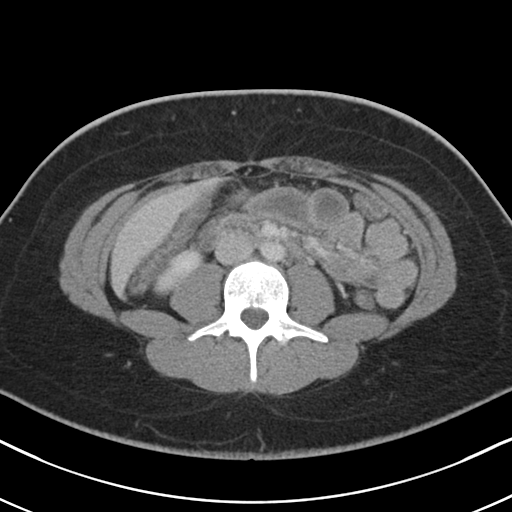
[im 56/93  soft-tissue]
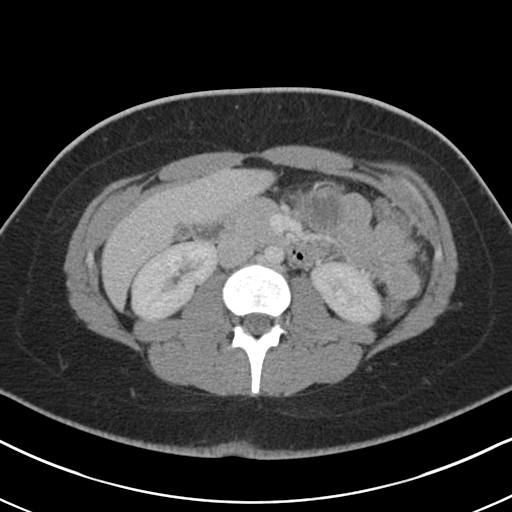
[im 65/93  soft-tissue]
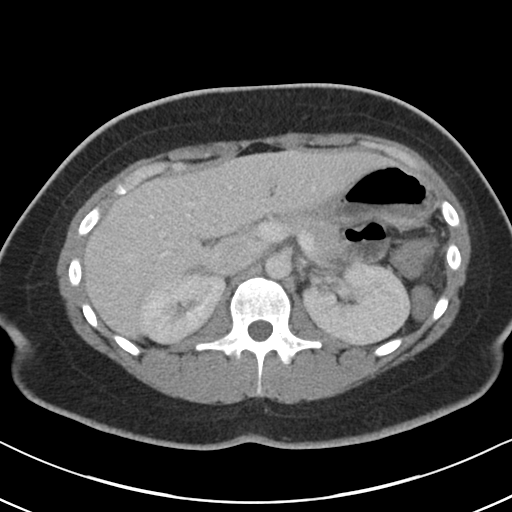
[im 65/93  bone]
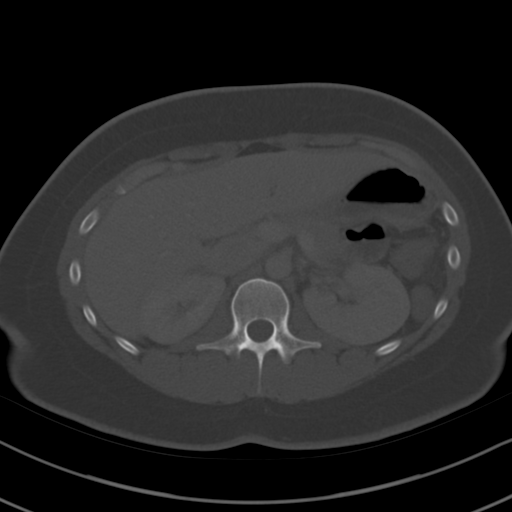
[im 74/93  soft-tissue]
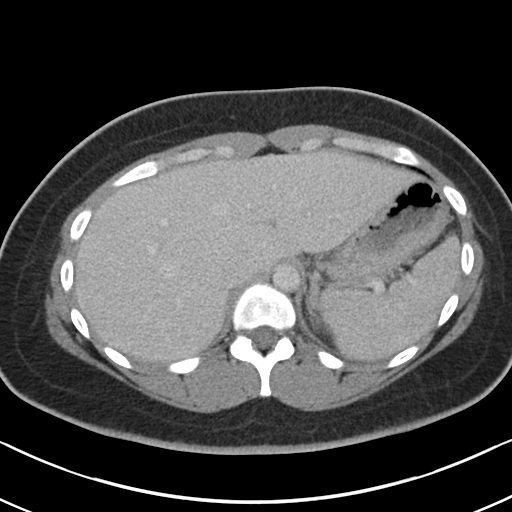
[im 79/93  soft-tissue]
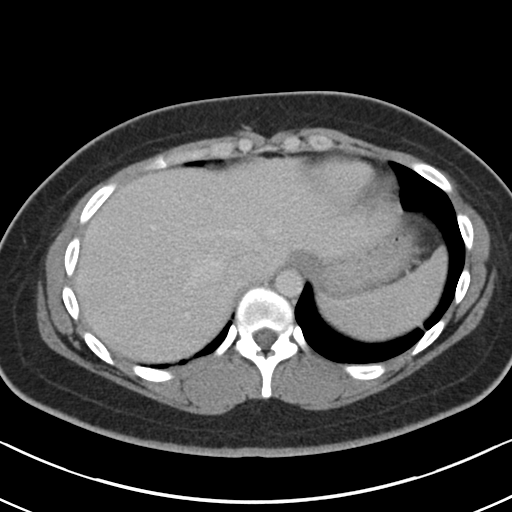
[im 88/93  soft-tissue]
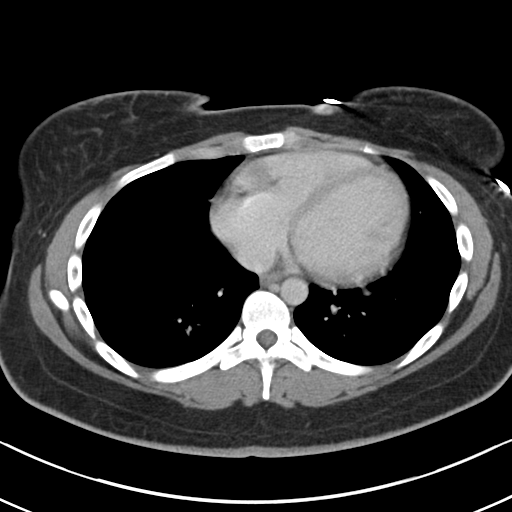

[Series 5: coronal a/|p · coronal · 0.66mm/px · 3 of 121 slices shown]
[im 41/121  soft-tissue]
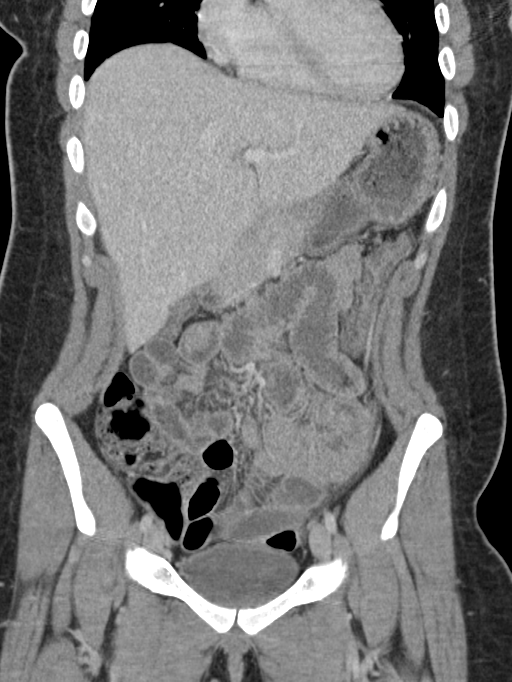
[im 54/121  soft-tissue]
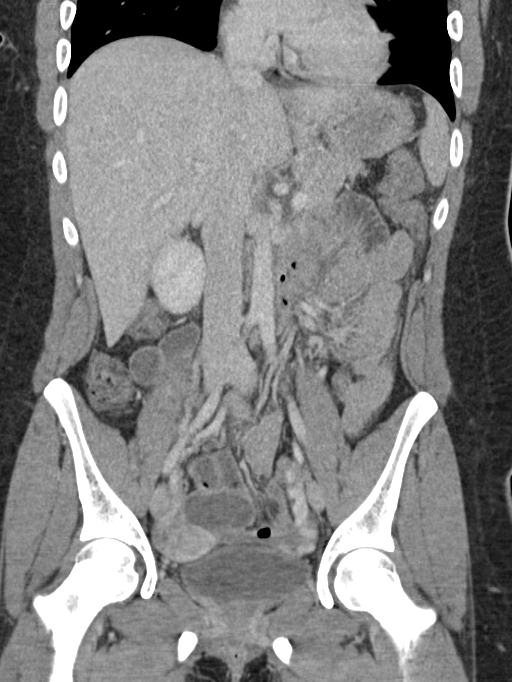
[im 67/121  soft-tissue]
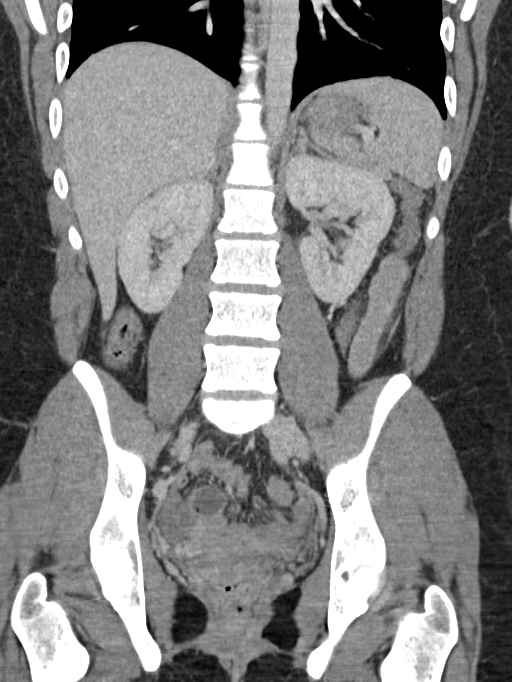

[15 of 46 positions shown; findings below may reference images not displayed]

FINDINGS: Lower chest: No acute abnormality.

Hepatobiliary: No focal liver abnormality is seen. No gallstones,
gallbladder wall thickening, or biliary dilatation.

Pancreas: Unremarkable. No pancreatic ductal dilatation or
surrounding inflammatory changes.

Spleen: Normal in size without focal abnormality.

Adrenals/Urinary Tract: Adrenal glands are unremarkable. Kidneys are
normal, without renal calculi, focal lesion, or hydronephrosis.
Bladder is unremarkable.

Stomach/Bowel: The stomach is normal in appearance. Evaluation of
the small bowel and colon is limited due to lack of oral contrast.
However, there is suggestion of colonic wall thickening and adjacent
fat stranding involving the ascending colon, transverse colon, and
descending colon. No pneumatosis. It would be difficult to
confidently exclude small bowel involvement. There is increased
attenuation of fat of the mesentary. No bowel obstruction. The
appendix is best visualized on coronal images with no evidence of
appendicitis.

Vascular/Lymphatic: No significant vascular findings are present. No
enlarged abdominal or pelvic lymph nodes.

Reproductive: The uterus is deviated to the right but otherwise
unremarkable. No adnexal masses are seen.

Other: There is a fat containing periumbilical hernia.

Musculoskeletal: No acute or significant osseous findings.
IMPRESSION: 1. Evaluation of the large and small bowel is limited due to lack of
oral contrast. However, there is suggested colonic wall thickening
and adjacent fat stranding consistent with colitis. Involvement of
the small bowel cannot be confidently excluded. Infectious and
inflammatory etiologies are possible. Inflammatory bowel disease
should be considered. Vascular causes are considered less likely.
2. No other acute abnormalities.

## 2019-05-03 ENCOUNTER — Emergency Department (HOSPITAL_COMMUNITY): Payer: Self-pay

## 2019-05-03 ENCOUNTER — Encounter (HOSPITAL_COMMUNITY): Payer: Self-pay | Admitting: Emergency Medicine

## 2019-05-03 ENCOUNTER — Other Ambulatory Visit: Payer: Self-pay

## 2019-05-03 ENCOUNTER — Emergency Department (HOSPITAL_COMMUNITY)
Admission: EM | Admit: 2019-05-03 | Discharge: 2019-05-03 | Disposition: A | Discharge status: Home / Self Care | Payer: Self-pay | Attending: Emergency Medicine | Admitting: Emergency Medicine

## 2019-05-03 DIAGNOSIS — F17210 Nicotine dependence, cigarettes, uncomplicated: Secondary | ICD-10-CM | POA: Insufficient documentation

## 2019-05-03 DIAGNOSIS — M25561 Pain in right knee: Secondary | ICD-10-CM | POA: Insufficient documentation

## 2019-05-03 DIAGNOSIS — W19XXXA Unspecified fall, initial encounter: Secondary | ICD-10-CM

## 2019-05-03 NOTE — Discharge Instructions (Signed)
Your x-ray today was within normal limits.  You may take some Aleve, over-the-counter anti-inflammatories to help with your pain.  You may also apply some heat to the area along with obtain an Ace wrap and wrap your knee for comfort.

## 2019-05-03 NOTE — ED Provider Notes (Signed)
[facility]-EMERGENCY DEPT Provider Note   CSN: [id number] Arrival date & time: 05/03/19  1647     History   Chief Complaint Chief Complaint  Patient presents with  . Knee Pain    HPI Erin Christensen is a 23 y.o. female.     23 y.o female with no PMH sent to the ED status post fall about 1 hour prior to arrival.  Patient reports she was walking around [employer] when she suddenly saw a slippage, she then proceeded to turn her ankle inwards, she then fell landing on her right knee.  She was able to stand after the accident, has been walking with a steady gait.  However, she does report some pain along her right ankle, this is exacerbated with ambulation and weightbearing.  She also endorses some right knee pain, states is very prominent below the patella.  Patient did not hit her head, denies any other trauma.  The history is provided by the patient.  Knee Pain Associated symptoms: no fever     Past Medical History:  Diagnosis Date  . Generalized headaches     Patient Active Problem List   Diagnosis Date Noted  . Marijuana use 01/20/2018  . Psychosis, paranoid (HCC) 01/19/2018  . Psychosis (HCC)   . Trichomonal vaginitis     History reviewed. No pertinent surgical history.   OB History   No obstetric history on file.      Home Medications    Prior to Admission medications   Medication Sig Start Date End Date Taking? Authorizing Provider  hydrOXYzine (ATARAX/VISTARIL) 25 MG tablet Take 1 tablet (25 mg total) by mouth 3 (three) times daily as needed for anxiety. 01/23/18   Armandina StammerNwoko, Agnes I, NP  OLANZapine (ZYPREXA) 20 MG tablet Take 1 tablet (20 mg total) by mouth at bedtime. For mood control 01/23/18   Armandina StammerNwoko, Agnes I, NP  traZODone (DESYREL) 50 MG tablet Take 1 tablet (50 mg total) by mouth at bedtime as needed for sleep. 01/23/18   Sanjuana KavaNwoko, Agnes I, NP    Family History Family History  Problem Relation Age of Onset  . Colon cancer Neg Hx   .  Stomach cancer Neg Hx   . Esophageal cancer Neg Hx     Social History Social History   Tobacco Use  . Smoking status: Current Every Day Smoker    Years: 0.50  . Smokeless tobacco: Never Used  Substance Use Topics  . Alcohol use: Yes    Comment: social  . Drug use: No     Allergies   Patient has no known allergies.   Review of Systems Review of Systems  Constitutional: Negative for fever.  Musculoskeletal: Positive for arthralgias.     Physical Exam Updated Vital Signs BP (!) 146/99 (BP Location: Left Arm)   Pulse 83   Temp 99.2 F (37.3 C) (Oral)   Resp 16   LMP 04/20/2019   SpO2 100%   Physical Exam Vitals signs and nursing note reviewed.  Constitutional:      General: She is not in acute distress.    Appearance: She is well-developed. She is obese.  HENT:     Head: Normocephalic and atraumatic.  Eyes:     Pupils: Pupils are equal, round, and reactive to light.  Neck:     Musculoskeletal: Normal range of motion.  Cardiovascular:     Rate and Rhythm: Regular rhythm.     Heart sounds: Normal heart sounds.  Pulmonary:  Effort: Pulmonary effort is normal. No respiratory distress.     Breath sounds: Normal breath sounds.  Abdominal:     Palpations: Abdomen is soft.  Musculoskeletal:        General: No tenderness or deformity.     Right knee: She exhibits swelling.     Right lower leg: No edema.     Left lower leg: No edema.     Comments: Full ROM of right ankle, pulses present, capillary refills okay.  5 out of 5 strength with plantarflexion, pain is exacerbated with dorsiflexion.  Skin:    General: Skin is warm and dry.  Neurological:     Mental Status: She is alert and oriented to person, place, and time.      ED Treatments / Results  Labs (all labs ordered are listed, but only abnormal results are displayed) Labs Reviewed - No data to display  EKG None  Radiology Dg Ankle Complete Right  Result Date: 05/03/2019 CLINICAL DATA:   Fall, pain EXAM: RIGHT ANKLE - COMPLETE 3+ VIEW COMPARISON:  None. FINDINGS: No fracture or dislocation of the right ankle. Joint spaces are well preserved. Soft tissues are unremarkable. IMPRESSION: No fracture or dislocation of the right ankle. Joint spaces are well preserved. Electronically Signed   By: Lauralyn Primes M.D.   On: 05/03/2019 18:27   Dg Knee Complete 4 Views Right  Result Date: 05/03/2019 CLINICAL DATA:  Fall EXAM: RIGHT KNEE - COMPLETE 4+ VIEW COMPARISON:  None. FINDINGS: No fracture or dislocation of the right knee. Joint spaces are preserved. There is a moderate, nonspecific knee joint effusion. Soft tissues are unremarkable. IMPRESSION: 1. No fracture or dislocation of the right knee. Joint spaces are preserved. 2.  There is a moderate, nonspecific knee joint effusion. Electronically Signed   By: Lauralyn Primes M.D.   On: 05/03/2019 18:26    Procedures Procedures (including critical care time)  Medications Ordered in ED Medications - No data to display   Initial Impression / Assessment and Plan / ED Course  I have reviewed the triage vital signs and the nursing notes.  Pertinent labs & imaging results that were available during my care of the patient were reviewed by me and considered in my medical decision making (see chart for details).       Patient with no pertinent past medical history presents to the ED status post mechanical fall.  Patient reports she was at [employer] when she slipped landing on her right knee.  She does report swelling to the right knee, has pain with ambulation with placing her weight on her right ankle.  She has not taken any medication for relieving symptoms.  Denies any other trauma, laceration.  X-ray of her right ankle show no dislocation, fracture.  An x-ray of her knee was obtained which showed some mild effusion. 1. No fracture or dislocation of the right knee. Joint spaces are  preserved.    2. There is a moderate, nonspecific knee  joint effusion.      These results were discussed with patient, rice therapy was encouraged for patient.  Patient understands and agrees with this management, return precautions discussed at length.    Portions of this note were generated with Scientist, clinical (histocompatibility and immunogenetics). Dictation errors may occur despite best attempts at proofreading.  Final Clinical Impressions(s) / ED Diagnoses   Final diagnoses:  Fall, initial encounter  Acute pain of right knee    ED Discharge Orders    None  Janeece Fitting, PA-C 05/03/19 Esaw Dace, MD 05/05/19 1155

## 2019-05-03 NOTE — ED Triage Notes (Signed)
Patient reports slip and fall in grocery store today. Reports right knee pain and right ankle pain. States pain worsens with movement. Denies head, neck, and back pain. Ambulatory.

## 2019-07-17 ENCOUNTER — Other Ambulatory Visit: Payer: Self-pay

## 2019-07-17 ENCOUNTER — Emergency Department (HOSPITAL_COMMUNITY)
Admission: EM | Admit: 2019-07-17 | Discharge: 2019-07-17 | Disposition: A | Discharge status: Home / Self Care | Payer: Self-pay | Attending: Emergency Medicine | Admitting: Emergency Medicine

## 2019-07-17 DIAGNOSIS — L02411 Cutaneous abscess of right axilla: Secondary | ICD-10-CM | POA: Insufficient documentation

## 2019-07-17 DIAGNOSIS — Z79899 Other long term (current) drug therapy: Secondary | ICD-10-CM | POA: Insufficient documentation

## 2019-07-17 DIAGNOSIS — F17210 Nicotine dependence, cigarettes, uncomplicated: Secondary | ICD-10-CM | POA: Insufficient documentation

## 2019-07-17 LAB — POC URINE PREG, ED: Preg Test, Ur: NEGATIVE

## 2019-07-17 MED ORDER — LIDOCAINE-EPINEPHRINE 2 %-1:100000 IJ SOLN
20.0000 mL | Freq: Once | INTRAMUSCULAR | Status: AC
  Administered 2019-07-17: 21:00:00 20 mL
  Filled 2019-07-17: qty 1

## 2019-07-17 MED ORDER — DOXYCYCLINE HYCLATE 100 MG PO CAPS: 100.0000 mg | ORAL_CAPSULE | Freq: Two times a day (BID) | ORAL | 0 refills | Status: DC

## 2019-07-17 NOTE — ED Notes (Signed)
Pt ambulatory to RR independently  

## 2019-07-17 NOTE — ED Triage Notes (Signed)
Pt arrived POV ambulatory into ED from home CC Boil Absess under right arm X 2 weeks. Pt reports Hx of boils/absess in both axilla. VSS Afebrile. PT states "this one just isn't going down on its own also my period has been off and on

## 2019-07-17 NOTE — ED Notes (Signed)
Supply at bedside

## 2019-07-17 NOTE — ED Provider Notes (Signed)
[facility] DEPT Provider Note   CSN: [id number] Arrival date & time: 07/17/19  1748     History Chief Complaint  Patient presents with  . Abscess    Right Erin Christensen is a 24 y.o. female.  The history is provided by the patient and medical records. No language interpreter was used.  Abscess  Erin Christensen is a 24 y.o. female who presents to the Emergency Department complaining of abscess. She presents the emergency department for evaluation of swelling and pain to the right axillary region. Symptoms began about two weeks ago and are progressively worsening. She complains of worsening swelling in the area. She is a history of similar symptoms in the past but these normally resolved without intervention. No reports of fevers. She does have irregular menses. No malaise. She does have mild nausea. Denies any additional symptoms.    Past Medical History:  Diagnosis Date  . Generalized headaches     Patient Active Problem List   Diagnosis Date Noted  . Marijuana use 01/20/2018  . Psychosis, paranoid ([place]) 01/19/2018  . Psychosis ([place])   . Trichomonal vaginitis     No past surgical history on file.   OB History   No obstetric history on file.     Family History  Problem Relation Age of Onset  . Colon cancer Neg Hx   . Stomach cancer Neg Hx   . Esophageal cancer Neg Hx     Social History   Tobacco Use  . Smoking status: Current Every Day Smoker    Years: 0.50  . Smokeless tobacco: Never Used  Substance Use Topics  . Alcohol use: Yes    Comment: social  . Drug use: No    Home Medications Prior to Admission medications   Medication Sig Start Date End Date Taking? Authorizing Provider  doxycycline (VIBRAMYCIN) 100 MG capsule Take 1 capsule (100 mg total) by mouth 2 (two) times daily. 07/17/19   Quintella Reichert, MD  hydrOXYzine (ATARAX/VISTARIL) 25 MG tablet Take 1 tablet (25 mg total) by mouth 3 (three) times daily  as needed for anxiety. 01/23/18   Lindell Spar I, NP  OLANZapine (ZYPREXA) 20 MG tablet Take 1 tablet (20 mg total) by mouth at bedtime. For mood control 01/23/18   Lindell Spar I, NP  traZODone (DESYREL) 50 MG tablet Take 1 tablet (50 mg total) by mouth at bedtime as needed for sleep. 01/23/18   Encarnacion Slates, NP    Allergies    Patient has no known allergies.  Review of Systems   Review of Systems  All other systems reviewed and are negative.   Physical Exam Updated Vital Signs BP (!) 148/102 (BP Location: Right Arm)   Pulse 83   Temp 98.5 F (36.9 C) (Oral)   Resp 18   LMP  (LMP Unknown)   SpO2 100%   Physical Exam Vitals and nursing note reviewed.  Constitutional:      Appearance: She is well-developed.  HENT:     Head: Normocephalic and atraumatic.  Cardiovascular:     Rate and Rhythm: Normal rate and regular rhythm.  Pulmonary:     Effort: Pulmonary effort is normal. No respiratory distress.  Musculoskeletal:     Comments: 2 x 3 cm area of focal fluctuance and erythema in the right axillary region c/w abscess. There is about 3 cm of surrounding erythema.  Skin:    General: Skin is warm and dry.  Neurological:  Mental Status: She is alert and oriented to person, place, and time.  Psychiatric:        Behavior: Behavior normal.     ED Results / Procedures / Treatments   Labs (all labs ordered are listed, but only abnormal results are displayed) Labs Reviewed  POC URINE PREG, ED    EKG None  Radiology No results found.  Procedures .Marland KitchenIncision and Drainage  Date/Time: 07/17/2019 7:41 PM Performed by: Tilden Fossa, MD Authorized by: Tilden Fossa, MD   Consent:    Consent obtained:  Verbal   Consent given by:  Patient   Risks discussed:  Bleeding, incomplete drainage, pain and infection Location:    Type:  Abscess   Location:  Upper extremity   Upper extremity location: right axilla. Pre-procedure details:    Skin preparation:   Betadine Anesthesia (see MAR for exact dosages):    Anesthesia method:  Local infiltration   Local anesthetic:  Lidocaine 2% WITH epi Procedure type:    Complexity:  Complex Procedure details:    Incision types:  Single straight   Scalpel blade:  11   Wound management:  Probed and deloculated and irrigated with saline   Drainage:  Purulent   Drainage amount:  Copious   Wound treatment:  Wound left open   Packing materials:  None Post-procedure details:    Patient tolerance of procedure:  Tolerated well, no immediate complications   (including critical care time)  Medications Ordered in ED Medications  lidocaine-EPINEPHrine (XYLOCAINE W/EPI) 2 %-1:100000 (with pres) injection 20 mL (has no administration in time range)    ED Course  I have reviewed the triage vital signs and the nursing notes.  Pertinent labs & imaging results that were available during my care of the patient were reviewed by me and considered in my medical decision making (see chart for details).    MDM Rules/Calculators/A&P                      Patient here for evaluation of right axillary abscess. She is non-toxic appearing on evaluation. I&D performed per procedure note. She does have surrounding erythema, will start on antibiotics. Counseled patient on home care for abscess. Discussed outpatient follow-up and return precautions. Final Clinical Impression(s) / ED Diagnoses Final diagnoses:  Abscess of axilla, right    Rx / DC Orders ED Discharge Orders         Ordered    doxycycline (VIBRAMYCIN) 100 MG capsule  2 times daily     07/17/19 2014           Tilden Fossa, MD 07/17/19 2017

## 2019-07-17 NOTE — ED Notes (Signed)
Pt verbalizes understanding of DC instructions. Pt belongings returned and is ambulatory out of ED.  

## 2019-09-08 ENCOUNTER — Other Ambulatory Visit: Payer: Self-pay

## 2019-09-08 ENCOUNTER — Encounter (HOSPITAL_COMMUNITY): Payer: Self-pay | Admitting: Emergency Medicine

## 2019-09-08 ENCOUNTER — Emergency Department (HOSPITAL_COMMUNITY)
Admission: EM | Admit: 2019-09-08 | Discharge: 2019-09-08 | Disposition: A | Discharge status: Home / Self Care | Payer: Self-pay | Attending: Emergency Medicine | Admitting: Emergency Medicine

## 2019-09-08 DIAGNOSIS — F17200 Nicotine dependence, unspecified, uncomplicated: Secondary | ICD-10-CM | POA: Insufficient documentation

## 2019-09-08 DIAGNOSIS — Z79899 Other long term (current) drug therapy: Secondary | ICD-10-CM | POA: Insufficient documentation

## 2019-09-08 DIAGNOSIS — J020 Streptococcal pharyngitis: Secondary | ICD-10-CM | POA: Insufficient documentation

## 2019-09-08 LAB — POC URINE PREG, ED: Preg Test, Ur: NEGATIVE

## 2019-09-08 MED ORDER — DEXAMETHASONE 2 MG PO TABS
10.0000 mg | ORAL_TABLET | Freq: Once | ORAL | Status: AC
  Administered 2019-09-08: 17:00:00 10 mg via ORAL
  Filled 2019-09-08: qty 2

## 2019-09-08 MED ORDER — PENICILLIN G BENZATHINE 1200000 UNIT/2ML IM SUSP
1.2000 10*6.[IU] | Freq: Once | INTRAMUSCULAR | Status: AC
  Administered 2019-09-08: 1.2 10*6.[IU] via INTRAMUSCULAR
  Filled 2019-09-08: qty 2

## 2019-09-08 NOTE — ED Provider Notes (Signed)
[facility]-EMERGENCY DEPT Provider Note   CSN: [id number] Arrival date & time: 09/08/19  1540     History Chief Complaint  Patient presents with  . Sore Throat    Erin Christensen is a 24 y.o. female presents to emergency department today with chief complaint of progressively worsening sore throat x 3 days.  She looked in the mirror and saw she had white patches on her tonsils.  She has been taking ibuprofen for pain, last dose was at 2 PM today.  She describes her sore throat as raw and scratchy sensation when swallowing.  She admits to subjective fever.  She denies chills, congestion, cough, ear pain, difficulty breahthing, chest pain, urinary symptoms, diarrhea.  Patient states she had strep throat a teenager and states this feels similar.  History provided by patient with additional history obtained from chart review.      Past Medical History:  Diagnosis Date  . Generalized headaches     Patient Active Problem List   Diagnosis Date Noted  . Marijuana use 01/20/2018  . Psychosis, paranoid (HCC) 01/19/2018  . Psychosis (HCC)   . Trichomonal vaginitis     History reviewed. No pertinent surgical history.   OB History   No obstetric history on file.     Family History  Problem Relation Age of Onset  . Colon cancer Neg Hx   . Stomach cancer Neg Hx   . Esophageal cancer Neg Hx     Social History   Tobacco Use  . Smoking status: Current Every Day Smoker    Years: 0.50  . Smokeless tobacco: Never Used  Substance Use Topics  . Alcohol use: Yes    Comment: social  . Drug use: No    Home Medications Prior to Admission medications   Medication Sig Start Date End Date Taking? Authorizing Provider  hydrOXYzine (ATARAX/VISTARIL) 25 MG tablet Take 1 tablet (25 mg total) by mouth 3 (three) times daily as needed for anxiety. 01/23/18   Armandina Stammer I, NP  OLANZapine (ZYPREXA) 20 MG tablet Take 1 tablet (20 mg total) by mouth at bedtime. For mood  control 01/23/18   Armandina Stammer I, NP  traZODone (DESYREL) 50 MG tablet Take 1 tablet (50 mg total) by mouth at bedtime as needed for sleep. 01/23/18 09/08/19  Sanjuana Kava, NP    Allergies    Patient has no known allergies.  Review of Systems   Review of Systems  All other systems are reviewed and are negative for acute change except as noted in the HPI.   Physical Exam Updated Vital Signs BP (!) 165/119 (BP Location: Left Arm)   Pulse 100   Temp 99.3 F (37.4 C) (Oral)   Resp 18   LMP 09/08/2019   SpO2 100%   Physical Exam Vitals and nursing note reviewed.  Constitutional:      Appearance: She is well-developed. She is not ill-appearing or toxic-appearing.  HENT:     Head: Normocephalic and atraumatic.     Nose: Nose normal.     Mouth/Throat:     Tonsils: Tonsillar exudate present. No tonsillar abscesses. 1+ on the right. 1+ on the left.  Eyes:     General: No scleral icterus.       Right eye: No discharge.        Left eye: No discharge.     Conjunctiva/sclera: Conjunctivae normal.  Neck:     Thyroid: No thyromegaly.     Vascular: No JVD.  Cardiovascular:     Rate and Rhythm: Normal rate and regular rhythm.     Pulses: Normal pulses.     Heart sounds: Normal heart sounds.  Pulmonary:     Effort: Pulmonary effort is normal.     Breath sounds: Normal breath sounds.  Abdominal:     General: There is no distension.  Musculoskeletal:        General: Normal range of motion.     Cervical back: Normal range of motion. No tenderness.  Lymphadenopathy:     Cervical: Cervical adenopathy present.  Skin:    General: Skin is warm and dry.     Findings: No rash.  Neurological:     Mental Status: She is oriented to person, place, and time.     GCS: GCS eye subscore is 4. GCS verbal subscore is 5. GCS motor subscore is 6.     Comments: Fluent speech, no facial droop.  Psychiatric:        Behavior: Behavior normal.     ED Results / Procedures / Treatments   Labs (all  labs ordered are listed, but only abnormal results are displayed) Labs Reviewed  POC URINE PREG, ED  POC URINE PREG, ED    EKG None  Radiology No results found.  Procedures Procedures (including critical care time)  Medications Ordered in ED Medications  penicillin g benzathine (BICILLIN LA) 1200000 UNIT/2ML injection 1.2 Million Units (has no administration in time range)  dexamethasone (DECADRON) tablet 10 mg (10 mg Oral Given 09/08/19 1654)    ED Course  I have reviewed the triage vital signs and the nursing notes.  Pertinent labs & imaging results that were available during my care of the patient were reviewed by me and considered in my medical decision making (see chart for details).    MDM Rules/Calculators/A&P                      24 yo female who presents with sore throat. Still able to tolerate PO/secretions but with worsening pain. Patient with low grade temp 99.3, non-toxic appearing, sitting comfortably on examination table. She took ibuprofen prior to arrival, suspect she was likely febrile at home. Vital signs reviewed and stable. On exam, she has bilateral tonsillar swelling 1+ with white exudate. Presentation not concerning for PTA or Ludwig's angina, Uvulitis, epiglottitis, peritonsillar abscess, or retropharyngeal abscess.   Patient's presentation and history is very suggestive of strep. Will go ahead and treat. Urine pregnancy is negative.  Pt treated with IM penicillin, steroids while in the ED. Pt does not appear dehydrated, but did discuss importance of water rehydration.  Encouraged at home supportive care measures. Patient successfully fluid challenged in the ED without difficulty swallowing.  Strict return precautions given. NAD. VSS. Recommended PCP follow up for re-evaluation.    Final Clinical Impression(s) / ED Diagnoses Final diagnoses:  Strep pharyngitis    Rx / DC Orders ED Discharge Orders    None       Flint Melter 09/08/19 1655    Lacretia Leigh, MD 09/08/19 2248

## 2019-09-08 NOTE — ED Notes (Signed)
Pt ambulatory to RR independently  

## 2019-09-08 NOTE — ED Triage Notes (Signed)
Pt reports that since Monday she had sore throat and swelling with white patches. Believes that she has strep throat. Took ibuprofen 400mg  at 2pm today.

## 2019-09-08 NOTE — ED Notes (Signed)
Pt verbalizes understanding of DC instructions. Pt belongings returned and is ambulatory out of ED.  

## 2019-09-08 NOTE — Discharge Instructions (Addendum)

## 2019-11-26 ENCOUNTER — Other Ambulatory Visit: Payer: Self-pay | Admitting: Nurse Practitioner

## 2019-11-26 DIAGNOSIS — N83299 Other ovarian cyst, unspecified side: Secondary | ICD-10-CM

## 2019-12-08 ENCOUNTER — Ambulatory Visit
Admission: RE | Admit: 2019-12-08 | Discharge: 2019-12-08 | Disposition: A | Discharge status: Home / Self Care | Payer: 59 | Source: Ambulatory Visit | Attending: Nurse Practitioner | Admitting: Nurse Practitioner

## 2019-12-08 DIAGNOSIS — N83299 Other ovarian cyst, unspecified side: Secondary | ICD-10-CM

## 2019-12-14 ENCOUNTER — Other Ambulatory Visit: Payer: Self-pay | Admitting: Nurse Practitioner

## 2019-12-14 DIAGNOSIS — N83291 Other ovarian cyst, right side: Secondary | ICD-10-CM

## 2020-01-10 ENCOUNTER — Other Ambulatory Visit: Payer: Self-pay

## 2020-01-10 ENCOUNTER — Ambulatory Visit
Admission: RE | Admit: 2020-01-10 | Discharge: 2020-01-10 | Disposition: A | Discharge status: Home / Self Care | Payer: 59 | Source: Ambulatory Visit | Attending: Nurse Practitioner | Admitting: Nurse Practitioner

## 2020-01-10 DIAGNOSIS — N83291 Other ovarian cyst, right side: Secondary | ICD-10-CM

## 2020-01-10 MED ORDER — GADOBENATE DIMEGLUMINE 529 MG/ML IV SOLN
19.0000 mL | Freq: Once | INTRAVENOUS | Status: AC | PRN
  Administered 2020-01-10: 19 mL via INTRAVENOUS

## 2020-08-03 ENCOUNTER — Other Ambulatory Visit: Payer: Self-pay

## 2020-08-03 ENCOUNTER — Encounter (HOSPITAL_COMMUNITY): Payer: Self-pay

## 2020-08-03 ENCOUNTER — Emergency Department (HOSPITAL_COMMUNITY)
Admission: EM | Admit: 2020-08-03 | Discharge: 2020-08-03 | Disposition: A | Discharge status: Home / Self Care | Payer: 59 | Attending: Emergency Medicine | Admitting: Emergency Medicine

## 2020-08-03 DIAGNOSIS — F17210 Nicotine dependence, cigarettes, uncomplicated: Secondary | ICD-10-CM | POA: Insufficient documentation

## 2020-08-03 DIAGNOSIS — R519 Headache, unspecified: Secondary | ICD-10-CM | POA: Diagnosis present

## 2020-08-03 DIAGNOSIS — U071 COVID-19: Secondary | ICD-10-CM | POA: Diagnosis not present

## 2020-08-03 DIAGNOSIS — B349 Viral infection, unspecified: Secondary | ICD-10-CM | POA: Diagnosis not present

## 2020-08-03 DIAGNOSIS — Z20822 Contact with and (suspected) exposure to covid-19: Secondary | ICD-10-CM

## 2020-08-03 LAB — GROUP A STREP BY PCR: Group A Strep by PCR: NOT DETECTED

## 2020-08-03 MED ORDER — ACETAMINOPHEN 325 MG PO TABS
650.0000 mg | ORAL_TABLET | Freq: Once | ORAL | Status: AC | PRN
  Administered 2020-08-03: 650 mg via ORAL
  Filled 2020-08-03: qty 2

## 2020-08-03 NOTE — Discharge Instructions (Addendum)
Please check MyChart to see results of your COVID test.  Follow isolation precautions for now.  If you develop difficulty breathing, chest pain or other new concerning symptom, return to ER for reassessment.  Take Tylenol or Motrin for pain control.

## 2020-08-03 NOTE — ED Notes (Signed)
Pt arrived via walk in, c/o nasal congestion, headache, body aches. No known covid exposure.

## 2020-08-04 LAB — SARS CORONAVIRUS 2 (TAT 6-24 HRS): SARS Coronavirus 2: POSITIVE — AB

## 2020-08-04 NOTE — ED Provider Notes (Signed)
[facility]-EMERGENCY DEPT Provider Note   CSN: [id number] Arrival date & time: 08/03/20  1827     History No chief complaint on file.   Erin Christensen is a 25 y.o. female.  Presents to ER with concern for sore throat, congestion, headache, body aches.  Unsure of any COVID contacts.  Symptoms for the past day or so.  Eating and drinking without difficulty.  Headache is mild, not sudden onset and not worst headache of her life.  Has not taken any medicine yet.  No chest pains or difficulty breathing.  Smokes marijuana, no tobacco abuse.  HPI     Past Medical History:  Diagnosis Date  . Generalized headaches     Patient Active Problem List   Diagnosis Date Noted  . Marijuana use 01/20/2018  . Psychosis, paranoid (HCC) 01/19/2018  . Psychosis (HCC)   . Trichomonal vaginitis     History reviewed. No pertinent surgical history.   OB History   No obstetric history on file.     Family History  Problem Relation Age of Onset  . Colon cancer Neg Hx   . Stomach cancer Neg Hx   . Esophageal cancer Neg Hx     Social History   Tobacco Use  . Smoking status: Current Every Day Smoker    Years: 0.50  . Smokeless tobacco: Never Used  Vaping Use  . Vaping Use: Never used  Substance Use Topics  . Alcohol use: Yes    Comment: social  . Drug use: No    Home Medications Prior to Admission medications   Medication Sig Start Date End Date Taking? Authorizing Provider  hydrOXYzine (ATARAX/VISTARIL) 25 MG tablet Take 1 tablet (25 mg total) by mouth 3 (three) times daily as needed for anxiety. 01/23/18   Armandina Stammer I, NP  OLANZapine (ZYPREXA) 20 MG tablet Take 1 tablet (20 mg total) by mouth at bedtime. For mood control 01/23/18   Armandina Stammer I, NP  traZODone (DESYREL) 50 MG tablet Take 1 tablet (50 mg total) by mouth at bedtime as needed for sleep. 01/23/18 09/08/19  Sanjuana Kava, NP    Allergies    Patient has no known allergies.  Review of Systems    Review of Systems  Constitutional: Positive for chills and fatigue. Negative for fever.  HENT: Positive for sore throat. Negative for ear pain.   Eyes: Negative for pain and visual disturbance.  Respiratory: Positive for cough. Negative for shortness of breath.   Cardiovascular: Negative for chest pain and palpitations.  Gastrointestinal: Negative for abdominal pain and vomiting.  Genitourinary: Negative for dysuria and hematuria.  Musculoskeletal: Positive for arthralgias and myalgias. Negative for back pain.  Skin: Negative for color change and rash.  Neurological: Negative for seizures and syncope.  All other systems reviewed and are negative.   Physical Exam Updated Vital Signs BP (!) 136/106   Pulse 75   Temp 99.5 F (37.5 C) (Oral)   Resp 18   SpO2 97%   Physical Exam Vitals and nursing note reviewed.  Constitutional:      General: She is not in acute distress.    Appearance: She is well-developed and well-nourished.  HENT:     Head: Normocephalic and atraumatic.     Mouth/Throat:     Comments: Erythema posterior oropharynx, small tonsillar exudate noted Eyes:     Conjunctiva/sclera: Conjunctivae normal.  Cardiovascular:     Rate and Rhythm: Normal rate and regular rhythm.     Heart  sounds: No murmur heard.   Pulmonary:     Effort: Pulmonary effort is normal. No respiratory distress.     Breath sounds: Normal breath sounds.  Abdominal:     Palpations: Abdomen is soft.     Tenderness: There is no abdominal tenderness.  Musculoskeletal:        General: No edema.     Cervical back: Neck supple.  Skin:    General: Skin is warm and dry.  Neurological:     Mental Status: She is alert.  Psychiatric:        Mood and Affect: Mood and affect normal.     ED Results / Procedures / Treatments   Labs (all labs ordered are listed, but only abnormal results are displayed) Labs Reviewed  GROUP A STREP BY PCR  SARS CORONAVIRUS 2 (TAT 6-24 HRS)     EKG None  Radiology No results found.  Procedures Procedures (including critical care time)  Medications Ordered in ED Medications  acetaminophen (TYLENOL) tablet 650 mg (650 mg Oral Given 08/03/20 1848)    ED Course  I have reviewed the triage vital signs and the nursing notes.  Pertinent labs & imaging results that were available during my care of the patient were reviewed by me and considered in my medical decision making (see chart for details).  Clinical Course as of 08/04/20 0010  Thu Aug 03, 2020  2122 [phone] [RD]    Clinical Course User Index [RD] Milagros Loll, MD   MDM Rules/Calculators/A&P                         25 year old lady presents to ER with concern for cough, congestion, sore throat, body aches and headache.  Constellation of symptoms most concerning for COVID-19.  We will send COVID testing.  Noted small exudate on tonsils, will send for strep testing as well.  She is well-appearing with normal vitals, no tachypnea or hypoxia, appropriate for outpatient management at this time.  After the discussed management above, the patient was determined to be safe for discharge.  The patient was in agreement with this plan and all questions regarding their care were answered.  ED return precautions were discussed and the patient will return to the ED with any significant worsening of condition.   Final Clinical Impression(s) / ED Diagnoses Final diagnoses:  Viral syndrome  Suspected COVID-19 virus infection    Rx / DC Orders ED Discharge Orders    None       Milagros Loll, MD 08/04/20 1514

## 2020-11-15 ENCOUNTER — Other Ambulatory Visit: Payer: Self-pay

## 2020-11-15 ENCOUNTER — Emergency Department (HOSPITAL_COMMUNITY)
Admission: EM | Admit: 2020-11-15 | Discharge: 2020-11-15 | Disposition: A | Discharge status: Home / Self Care | Payer: 59 | Attending: Emergency Medicine | Admitting: Emergency Medicine

## 2020-11-15 ENCOUNTER — Encounter (HOSPITAL_COMMUNITY): Payer: Self-pay

## 2020-11-15 DIAGNOSIS — F17200 Nicotine dependence, unspecified, uncomplicated: Secondary | ICD-10-CM | POA: Insufficient documentation

## 2020-11-15 DIAGNOSIS — R000 Tachycardia, unspecified: Secondary | ICD-10-CM | POA: Insufficient documentation

## 2020-11-15 DIAGNOSIS — Z20822 Contact with and (suspected) exposure to covid-19: Secondary | ICD-10-CM | POA: Diagnosis not present

## 2020-11-15 DIAGNOSIS — J029 Acute pharyngitis, unspecified: Secondary | ICD-10-CM

## 2020-11-15 DIAGNOSIS — R070 Pain in throat: Secondary | ICD-10-CM | POA: Diagnosis present

## 2020-11-15 LAB — MONONUCLEOSIS SCREEN: Mono Screen: NEGATIVE

## 2020-11-15 LAB — RESP PANEL BY RT-PCR (FLU A&B, COVID) ARPGX2
Influenza A by PCR: NEGATIVE
Influenza B by PCR: NEGATIVE
SARS Coronavirus 2 by RT PCR: NEGATIVE

## 2020-11-15 LAB — GROUP A STREP BY PCR: Group A Strep by PCR: NOT DETECTED

## 2020-11-15 MED ORDER — DEXAMETHASONE SODIUM PHOSPHATE 10 MG/ML IJ SOLN
10.0000 mg | Freq: Once | INTRAMUSCULAR | Status: AC
  Administered 2020-11-15: 10 mg via INTRAMUSCULAR
  Filled 2020-11-15: qty 1

## 2020-11-15 MED ORDER — LIDOCAINE VISCOUS HCL 2 % MT SOLN: 15.0000 mL | OROMUCOSAL | 0 refills | Status: AC | PRN

## 2020-11-15 MED ORDER — NAPROXEN 500 MG PO TABS: 500.0000 mg | ORAL_TABLET | Freq: Two times a day (BID) | ORAL | 0 refills | Status: AC | PRN

## 2020-11-15 MED ORDER — ACETAMINOPHEN 325 MG PO TABS
650.0000 mg | ORAL_TABLET | Freq: Once | ORAL | Status: AC
  Administered 2020-11-15: 650 mg via ORAL
  Filled 2020-11-15: qty 2

## 2020-11-15 NOTE — ED Triage Notes (Signed)
Pt arrived via sore throat x5 days. On and off fevers, manageable with tylenol. Denies any sick contacts.

## 2020-11-15 NOTE — ED Provider Notes (Signed)
[facility] Provider Note   CSN: [id number] Arrival date & time: 11/15/20  1110     History Chief Complaint  Patient presents with  . Sore Throat    Erin Christensen is a 25 y.o. female with a history of tobacco use who presents to the emergency department with complaints of sore throat for the past 4 days.  Patient states her throat is constant, worse with swallowing but she is able to swallow, no other significant alleviating or aggravating factors.  She has had associated nasal congestion, myalgias, sweats, and chills.  Last took Tylenol last night.  Denies chest pain, cough, dyspnea, vomiting, abdominal pain, or diarrhea.  HPI     Past Medical History:  Diagnosis Date  . Generalized headaches     Patient Active Problem List   Diagnosis Date Noted  . Marijuana use 01/20/2018  . Psychosis, paranoid (HCC) 01/19/2018  . Psychosis (HCC)   . Trichomonal vaginitis     No past surgical history on file.   OB History   No obstetric history on file.     Family History  Problem Relation Age of Onset  . Colon cancer Neg Hx   . Stomach cancer Neg Hx   . Esophageal cancer Neg Hx     Social History   Tobacco Use  . Smoking status: Current Every Day Smoker    Years: 0.50  . Smokeless tobacco: Never Used  Vaping Use  . Vaping Use: Never used  Substance Use Topics  . Alcohol use: Yes    Comment: social  . Drug use: No    Home Medications Prior to Admission medications   Medication Sig Start Date End Date Taking? Authorizing Provider  hydrOXYzine (ATARAX/VISTARIL) 25 MG tablet Take 1 tablet (25 mg total) by mouth 3 (three) times daily as needed for anxiety. 01/23/18   Armandina Stammer I, NP  OLANZapine (ZYPREXA) 20 MG tablet Take 1 tablet (20 mg total) by mouth at bedtime. For mood control 01/23/18   Armandina Stammer I, NP  traZODone (DESYREL) 50 MG tablet Take 1 tablet (50 mg total) by mouth at bedtime as needed for sleep. 01/23/18 09/08/19   Sanjuana Kava, NP    Allergies    Patient has no known allergies.  Review of Systems   Review of Systems  Constitutional: Positive for chills and fever (sweats).  HENT: Positive for congestion and sore throat. Negative for ear pain.   Respiratory: Negative for cough and shortness of breath.   Cardiovascular: Negative for chest pain.  Gastrointestinal: Negative for abdominal pain and vomiting.  Neurological: Negative for syncope.  All other systems reviewed and are negative.   Physical Exam Updated Vital Signs BP (!) 155/91 (BP Location: Left Arm)   Pulse (!) 106   Temp (!) 100.7 F (38.2 C) (Oral)   Resp 16   Ht 5\' 7"  (1.702 m)   Wt 104.3 kg   SpO2 99%   BMI 36.02 kg/m   Physical Exam Vitals and nursing note reviewed.  Constitutional:      General: She is not in acute distress.    Appearance: She is well-developed.  HENT:     Head: Normocephalic and atraumatic.     Right Ear: Ear canal normal. Tympanic membrane is not perforated, erythematous, retracted or bulging.     Left Ear: Ear canal normal. Tympanic membrane is not perforated, erythematous, retracted or bulging.     Ears:     Comments: No mastoid erythema/swelling/tenderness.  Nose:     Right Sinus: No maxillary sinus tenderness or frontal sinus tenderness.     Left Sinus: No maxillary sinus tenderness or frontal sinus tenderness.     Mouth/Throat:     Pharynx: Uvula midline. Oropharyngeal exudate and posterior oropharyngeal erythema present.     Tonsils: 2+ on the right. 2+ on the left.     Comments: Posterior oropharynx is symmetric appearing. Patient tolerating own secretions without difficulty. No trismus. No drooling. No hot potato voice. No swelling beneath the tongue, submandibular compartment is soft.  Eyes:     General:        Right eye: No discharge.        Left eye: No discharge.     Conjunctiva/sclera: Conjunctivae normal.     Pupils: Pupils are equal, round, and reactive to light.   Cardiovascular:     Rate and Rhythm: Regular rhythm. Tachycardia present.     Heart sounds: No murmur heard.   Pulmonary:     Effort: Pulmonary effort is normal. No respiratory distress.     Breath sounds: Normal breath sounds. No wheezing, rhonchi or rales.  Abdominal:     General: There is no distension.     Palpations: Abdomen is soft.     Tenderness: There is no abdominal tenderness.  Musculoskeletal:     Cervical back: Normal range of motion and neck supple. No edema or rigidity.  Lymphadenopathy:     Cervical: No cervical adenopathy.  Skin:    General: Skin is warm and dry.     Findings: No rash.  Neurological:     Mental Status: She is alert.  Psychiatric:        Behavior: Behavior normal.     ED Results / Procedures / Treatments   Labs (all labs ordered are listed, but only abnormal results are displayed) Labs Reviewed  GROUP A STREP BY PCR  RESP PANEL BY RT-PCR (FLU A&B, COVID) [id number]  MONONUCLEOSIS SCREEN    EKG None  Radiology No results found.  Procedures Procedures   Medications Ordered in ED Medications  dexamethasone (DECADRON) injection 10 mg (has no administration in time range)  acetaminophen (TYLENOL) tablet 650 mg (650 mg Oral Given 11/15/20 1142)    ED Course  I have reviewed the triage vital signs and the nursing notes.  Pertinent labs & imaging results that were available during my care of the patient were reviewed by me and considered in my medical decision making (see chart for details).    MDM Rules/Calculators/A&P                          Patient presents to the ED with complaints of sore throat.  Patient is nontoxic, mildly febrile and tachycardic on arrival.  BP also somewhat elevated, doubt hypertensive emergency.  Additional history obtained:  Additional history obtained from chart review & nursing note review.   Lab Tests:  I Ordered, reviewed, and interpreted labs, which included:  COVID: Negative Influenza:  Negative Strep test: Negative  ED Course:  No sinus tenderness, symptoms really 3 to 4 days, do not suspect acute bacterial sinusitis.  No signs of AOM, AOE, or mastoiditis.  Strep test is negative.  Exam is not consistent with RPA/PTA.  No meningismus.  Lungs are clear, do not suspect acute bacterial pneumonia.  COVID/influenza are negative.  Will obtain mononucleosis swab.  At this point suspect viral.  Will treat supportively.  PCP follow-up. I discussed  results, treatment plan, need for follow-up, and return precautions with the patient. Provided opportunity for questions, patient confirmed understanding and is in agreement with plan.   Portions of this note were generated with Scientist, clinical (histocompatibility and immunogenetics). Dictation errors may occur despite best attempts at proofreading.  Final Clinical Impression(s) / ED Diagnoses Final diagnoses:  Sore throat    Rx / DC Orders ED Discharge Orders         Ordered    naproxen (NAPROSYN) 500 MG tablet  2 times daily PRN        11/15/20 1326    lidocaine (XYLOCAINE) 2 % solution  Every 4 hours PRN        11/15/20 [address] [place] R, PA-C 11/15/20 1331    Milagros Loll, MD 11/17/20 2006

## 2020-11-15 NOTE — Discharge Instructions (Addendum)
You were seen in the emergency department today for a sore throat.  Your strep test, COVID test, and flu test were negative.  We suspect that your symptoms are viral in nature, we are sending you with viscous lidocaine to use every 4 hours as needed for pain.  We are sending you with naproxen to take every 12 hours as needed for pain.  Please be sure to take naproxen with food as it can cause stomach upset and it or stomach bleeding.  Do not take other NSAIDs such as Motrin, Advil, Aleve, ibuprofen, Goody powder, Mobic, etc. as these are similar.  We have prescribed you new medication(s) today. Discuss the medications prescribed today with your pharmacist as they can have adverse effects and interactions with your other medicines including over the counter and prescribed medications. Seek medical evaluation if you start to experience new or abnormal symptoms after taking one of these medicines, seek care immediately if you start to experience difficulty breathing, feeling of your throat closing, facial swelling, or rash as these could be indications of a more serious allergic reaction  We have tested you for mononucleosis, we will call you if this is positive, you may also see the results in MyChart. Please follow-up with primary care within 3 days.  Return to the ER for new or worsening symptoms including but not limited to increased pain, inability to swallow, drooling, passing out, abdominal pain, trouble breathing, chest pain, neck stiffness, or any other concerns.
